# Patient Record
Sex: Female | Born: 1965 | Race: White | Hispanic: No | Marital: Single | Smoking: Never smoker
Health system: Southern US, Community
[De-identification: ages and names within clinical notes are randomized; demographics above are authoritative.]

## PROBLEM LIST (undated history)

## (undated) DIAGNOSIS — H3320 Serous retinal detachment, unspecified eye: Secondary | ICD-10-CM

## (undated) DIAGNOSIS — F5101 Primary insomnia: Secondary | ICD-10-CM

## (undated) DIAGNOSIS — M19049 Primary osteoarthritis, unspecified hand: Secondary | ICD-10-CM

## (undated) DIAGNOSIS — E0590 Thyrotoxicosis, unspecified without thyrotoxic crisis or storm: Secondary | ICD-10-CM

## (undated) DIAGNOSIS — F419 Anxiety disorder, unspecified: Secondary | ICD-10-CM

## (undated) HISTORY — DX: Anxiety disorder, unspecified: F41.9

## (undated) HISTORY — DX: Primary osteoarthritis, unspecified hand: M19.049

## (undated) HISTORY — PX: OTHER SURGICAL HISTORY: SHX169

## (undated) HISTORY — DX: Thyrotoxicosis, unspecified without thyrotoxic crisis or storm: E05.90

## (undated) HISTORY — DX: Serous retinal detachment, unspecified eye: H33.20

## (undated) HISTORY — DX: Primary insomnia: F51.01

## (undated) HISTORY — PX: BREAST REDUCTION SURGERY: SHX8

## (undated) HISTORY — PX: PLACEMENT OF BREAST IMPLANTS: SHX6334

---

## 2017-07-08 DIAGNOSIS — E038 Other specified hypothyroidism: Secondary | ICD-10-CM | POA: Insufficient documentation

## 2017-07-08 DIAGNOSIS — R011 Cardiac murmur, unspecified: Secondary | ICD-10-CM | POA: Insufficient documentation

## 2017-07-08 DIAGNOSIS — R002 Palpitations: Secondary | ICD-10-CM | POA: Insufficient documentation

## 2017-07-08 DIAGNOSIS — R000 Tachycardia, unspecified: Secondary | ICD-10-CM | POA: Insufficient documentation

## 2018-01-24 ENCOUNTER — Other Ambulatory Visit: Payer: Self-pay | Admitting: Gerontology

## 2018-01-24 DIAGNOSIS — N63 Unspecified lump in unspecified breast: Secondary | ICD-10-CM

## 2018-02-07 ENCOUNTER — Ambulatory Visit
Admission: RE | Admit: 2018-02-07 | Discharge: 2018-02-07 | Disposition: A | Discharge status: Home / Self Care | Payer: PRIVATE HEALTH INSURANCE | Source: Ambulatory Visit | Attending: Gerontology | Admitting: Gerontology

## 2018-02-07 ENCOUNTER — Other Ambulatory Visit: Payer: Self-pay

## 2018-02-07 DIAGNOSIS — N63 Unspecified lump in unspecified breast: Secondary | ICD-10-CM

## 2018-03-22 ENCOUNTER — Encounter: Payer: Self-pay | Admitting: Neurology

## 2018-05-26 DIAGNOSIS — Z9882 Breast implant status: Secondary | ICD-10-CM | POA: Insufficient documentation

## 2018-06-02 ENCOUNTER — Institutional Professional Consult (permissible substitution): Payer: PRIVATE HEALTH INSURANCE | Admitting: Plastic Surgery

## 2018-06-02 ENCOUNTER — Encounter: Payer: Self-pay | Admitting: Plastic Surgery

## 2018-06-02 ENCOUNTER — Ambulatory Visit: Payer: PRIVATE HEALTH INSURANCE | Admitting: Plastic Surgery

## 2018-06-02 VITALS — BP 133/81 | HR 76 | Temp 98.4°F | Ht 65.0 in | Wt 185.0 lb

## 2018-06-02 DIAGNOSIS — Z9882 Breast implant status: Secondary | ICD-10-CM

## 2018-06-02 NOTE — Progress Notes (Signed)
Patient ID: Deborah Crawford, female    DOB: July 22, 1965, 53 y.o.   MRN: [id number]   Chief Complaint  Patient presents with  . Advice Only    Remove and replace implants    Patient is a 53 year old white female here for consultation for her breasts.  The patient has had a somewhat complicated history of breast augmentation.  [place] was living in [place] until about a year ago when she moved to [place].  All the surgeries were done prior to her move.  She had saline implants placed for a marked asymmetry in her breasts.  The right was much smaller than the left.  There was a problem with the implants twice at which time she had the saline implants removed.  She had 680 cc implants placed.  At some point she had a reduction bilaterally. About 2 years ago she had the large implants removed and placement of Allergan Naturelle 295 cc textured anatomic silicone implants (SN [id number] reference TRM-295).  She is happy with the appearance and symmetry.  Her major concern today is the textured implants and chronic pain in her breasts.  She gets mammograms regularly and is due for one in February.  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  She is 5 feet 5 inches tall, weight 185 pounds.  Preop bra 36 D.  She does not want to be any larger perhaps a little bit smaller.  She has some bottoming out of the current implants.  Her areola to sternal distance is 23 cm bilaterally.  She definitely does not want textured implants placed.   Review of Systems  Constitutional: Negative for activity change.  HENT: Negative.   Eyes: Positive for discharge.  Respiratory: Negative.   Cardiovascular: Negative.  Negative for leg swelling.  Gastrointestinal: Negative.  Negative for abdominal distention and abdominal pain.  Endocrine: Negative.   Genitourinary: Negative.   Musculoskeletal: Negative.   Skin: Negative.  Negative for wound.  Hematological: Negative.   Psychiatric/Behavioral: Negative.     History  reviewed. No pertinent past medical history.  History reviewed. No pertinent surgical history.    Current Outpatient Medications:  .  DULoxetine (CYMBALTA) 30 MG capsule, Take 1 capsule by mouth daily., Disp: , Rfl:  .  levothyroxine (SYNTHROID, LEVOTHROID) 150 MCG tablet, Take 1 tablet by mouth daily., Disp: , Rfl:    Objective:   Vitals:   06/02/18 0830  BP: 133/81  Pulse: 76  Temp: 98.4 F (36.9 C)  SpO2: 96%    Physical Exam Vitals signs and nursing note reviewed.  Constitutional:      Appearance: Normal appearance.  HENT:     Head: Normocephalic and atraumatic.     Nose: Nose normal.  Eyes:     Extraocular Movements: Extraocular movements intact.     Pupils: Pupils are equal, round, and reactive to light.  Cardiovascular:     Rate and Rhythm: Normal rate.  Pulmonary:     Effort: Pulmonary effort is normal.  Abdominal:     General: Abdomen is flat. There is no distension.     Tenderness: There is no abdominal tenderness. There is no guarding.  Neurological:     General: No focal deficit present.     Mental Status: She is alert.  Psychiatric:        Mood and Affect: Mood normal.        Thought Content: Thought content normal.        Judgment: Judgment normal.  Assessment & Plan:  History of breast implant  The patient Is interested in a quote for removal of bilateral breast implants and replacement with smooth gel implants.  She will need a little bit of a mastopexy as well. She is going to get her next mammogram in February and will send [place] the results.  We also discussed high protein diet and adding MVI and Vit C.  Alena Bills , DO

## 2018-06-02 NOTE — Progress Notes (Signed)
[facility] Clinic Note - Initial Visit   Date: 06/05/18  RYLA CAUTHON MRN: [id number] DOB: [birth year]   Dear Dr. Fara Olden:   Thank you for your kind referral of Deborah Crawford for consultation of bilateral hand paresthesias. Although her history is well known to you, please allow [place] to reiterate it for the purpose of our medical record. The patient was accompanied to the clinic by self.    History of Present Illness: Deborah Crawford is a 53 y.o. right-handed Caucasian female with thyroidism, anxiety/depression, and insomnia presenting for evaluation of bilateral hand paresthesias.    Starting in 2015, she developed intermittent numbness over the right hand.  Since the fall of 2019, she began having numbness of the left hand and sometimes, her entire arms feel numb.  She also has tingling over the hands, which is worse over the 4th digit on the right.  Symptoms are worse at nighttime and often wake her up from sleeping.  She often drops objects and has a sensation of weakness.  She does not have neck pain or radicular symptoms.  She now has constant nagging pain over the hands.  She works as a Insurance underwriter and has difficulty with tasks because of her hand discomfort.  She has tried accupuncture, cupping, and chiropractor services without any lasting relief.    She has mid-low back pain, especially between the scapula which is more achy.    Out-side paper records, electronic medical record, and images have been reviewed where available and summarized as:  Labs 02/27/2018: TSH 0.9, ESR 9, RF negative  Past Medical History:  Diagnosis Date  . Anxiety   . Hand arthritis   . hypothyroidism   . Primary insomnia   . Retinal detachment     Past Surgical History:  Procedure Laterality Date  . BREAST REDUCTION SURGERY    . PLACEMENT OF BREAST IMPLANTS    . tummy tuck       Medications:  Outpatient Encounter Medications as of  06/05/2018  Medication Sig  . DULoxetine (CYMBALTA) 30 MG capsule Take 1 capsule by mouth daily.  [name] levothyroxine (SYNTHROID, LEVOTHROID) 150 MCG tablet Take 1 tablet by mouth daily.   No facility-administered encounter medications on file as of 06/05/2018.      Allergies:  Allergies  Allergen Reactions  . Oxycodone-Acetaminophen Other (See Comments)    Severe migraine  . Latex Other (See Comments), Itching and Rash    Family History: Family History  Problem Relation Age of Onset  . Esophageal cancer Mother   . Rashes / Skin problems Mother   . Rheum arthritis Mother   . Hypertension Father   . Hypertension Maternal Grandmother   . Hypertension Maternal Grandfather     Social History: Social History   Tobacco Use  . Smoking status: Never Smoker  . Smokeless tobacco: Never Used  Substance Use Topics  . Alcohol use: Yes    Comment: Socially on the weekends  . Drug use: Never   Social History   Social History Narrative   Lives alone in a 2 story home.  No children.    Highest level of education:  Bachelors x 2    Review of Systems:  CONSTITUTIONAL: No fevers, chills, night sweats, or weight loss.   EYES: No visual changes or eye pain ENT: No hearing changes.  No history of nose bleeds.   RESPIRATORY: No cough, wheezing and shortness of breath.   CARDIOVASCULAR: Negative for chest pain, and  palpitations.   GI: Negative for abdominal discomfort, blood in stools or black stools.  No recent change in bowel habits.   GU:  No history of incontinence.   MUSCLOSKELETAL: No history of joint pain or swelling.  No myalgias.   SKIN: Negative for lesions, rash, and itching.   HEMATOLOGY/ONCOLOGY: Negative for prolonged bleeding, bruising easily, and swollen nodes.  No history of cancer.   ENDOCRINE: Negative for cold or heat intolerance, polydipsia or goiter.   PSYCH:  No depression or anxiety symptoms.   NEURO: As Above.   Vital Signs:  BP 130/80   Pulse 78   Ht 5' 5"   (1.651 m)   Wt 188 lb 4 oz (85.4 kg)   SpO2 98%   BMI 31.33 kg/m   General Medical Exam:   General:  Well appearing, comfortable.   Eyes/ENT: see cranial nerve examination.   Neck: No masses appreciated.  Full range of motion without tenderness.  No carotid bruits. Respiratory:  Clear to auscultation, good air entry bilaterally.   Cardiac:  Regular rate and rhythm, no murmur.   Extremities:  No deformities, edema, or skin discoloration.  Skin:  No rashes or lesions.  Neurological Exam: MENTAL STATUS including orientation to time, place, person, recent and remote memory, attention span and concentration, language, and fund of knowledge is normal.  Speech is not dysarthric.  CRANIAL NERVES: II:  No visual field defects.  Unremarkable fundi.   III-IV-VI: Pupils equal round and reactive to light.  Normal conjugate, extra-ocular eye movements in all directions of gaze.  No nystagmus.  No ptosis.   V:  Normal facial sensation.       VII:  Normal facial symmetry and movements.   VIII:  Normal hearing and vestibular function.   IX-X:  Normal palatal movement.   XI:  Normal shoulder shrug and head rotation.   XII:  Normal tongue strength and range of motion, no deviation or fasciculation.  MOTOR:  No atrophy, fasciculations or abnormal movements.  No pronator drift.  Tone is normal.    Right Upper Extremity:    Left Upper Extremity:    Deltoid  5/5   Deltoid  5/5   Biceps  5/5   Biceps  5/5   Triceps  5/5   Triceps  5/5   Wrist extensors  5/5   Wrist extensors  5/5   Wrist flexors  5/5   Wrist flexors  5/5   Finger extensors  5/5   Finger extensors  5/5   Finger flexors  5/5   Finger flexors  5/5   Dorsal interossei  5/5   Dorsal interossei  5/5   Abductor pollicis  5/5   Abductor pollicis  5/5   Tone (Ashworth scale)  0  Tone (Ashworth scale)  0   Right Lower Extremity:    Left Lower Extremity:    Hip flexors  5/5   Hip flexors  5/5   Hip extensors  5/5   Hip extensors  5/5     Knee flexors  5/5   Knee flexors  5/5   Knee extensors  5/5   Knee extensors  5/5   Dorsiflexors  5/5   Dorsiflexors  5/5   Plantarflexors  5/5   Plantarflexors  5/5   Toe extensors  5/5   Toe extensors  5/5   Toe flexors  5/5   Toe flexors  5/5   Tone (Ashworth scale)  0  Tone (Ashworth scale)  0  MSRs:  Right                                                                 Left brachioradialis 2+  brachioradialis 2+  biceps 2+  biceps 2+  triceps 2+  triceps 2+  patellar 2+  patellar 2+  ankle jerk 2+  ankle jerk 2+  Hoffman no  Hoffman no  plantar response down  plantar response down   SENSORY:  Mildly reduced temperature over the right 4th and 5th digit, otherwise, normal and symmetric perception of light touch, pinprick, vibration, and proprioception. Tinel's positive at at the wrist bilaterally, negative at the elbows.    COORDINATION/GAIT: Normal finger-to- nose-finger.  Intact rapid alternating movements bilaterally.  Gait narrow based and stable. Tandem and stressed gait intact.    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  Exam shows bilateral Tinel's sign at the wrist, which raises concern for CTS, however, clinically, she has paresthesias over the ulnar distribution. I will perform electrodiagnostic testing of the upper extremities to better localize  of her symptoms. In the meantime, I asked her to minimize hyperflexion at the wrist and elbow to see if this alleviates some of her discomfort.   Further recommendations pending results.   Thank you for allowing me to participate in patient's care.  If I can answer any additional questions, I would be pleased to do so.    Sincerely,    Christina Gintz K. Posey Pronto, DO

## 2018-06-05 ENCOUNTER — Ambulatory Visit: Payer: PRIVATE HEALTH INSURANCE | Admitting: Neurology

## 2018-06-05 ENCOUNTER — Encounter: Payer: Self-pay | Admitting: Neurology

## 2018-06-05 VITALS — BP 130/80 | HR 78 | Ht 65.0 in | Wt 188.2 lb

## 2018-06-05 DIAGNOSIS — R202 Paresthesia of skin: Secondary | ICD-10-CM

## 2018-06-05 NOTE — Patient Instructions (Addendum)
NCS/EMG of the arms. Please do not apply lotion.  ELECTROMYOGRAM AND NERVE CONDUCTION STUDIES (EMG/NCS) INSTRUCTIONS  How to Prepare The neurologist conducting the EMG will need to know if you have certain medical conditions. Tell the neurologist and other EMG lab personnel if you: . Have a pacemaker or any other electrical medical device . Take blood-thinning medications . Have hemophilia, a blood-clotting disorder that causes prolonged bleeding Bathing Take a shower or bath shortly before your exam in order to remove oils from your skin. Don't apply lotions or creams before the exam.  What to Expect You'll likely be asked to change into a hospital gown for the procedure and lie down on an examination table. The following explanations can help you understand what will happen during the exam.  . Electrodes. The neurologist or a technician places surface electrodes at various locations on your skin depending on where you're experiencing symptoms. Or the neurologist may insert needle electrodes at different sites depending on your symptoms.  . Sensations. The electrodes will at times transmit a tiny electrical current that you may feel as a twinge or spasm. The needle electrode may cause discomfort or pain that usually ends shortly after the needle is removed. If you are concerned about discomfort or pain, you may want to talk to the neurologist about taking a short break during the exam.  . Instructions. During the needle EMG, the neurologist will assess whether there is any spontaneous electrical activity when the muscle is at rest - activity that isn't present in healthy muscle tissue - and the degree of activity when you slightly contract the muscle.  He or she will give you instructions on resting and contracting a muscle at appropriate times. Depending on what muscles and nerves the neurologist is examining, he or she may ask you to change positions during the exam.  After your EMG You may  experience some temporary, minor bruising where the needle electrode was inserted into your muscle. This bruising should fade within several days. If it persists, contact your primary care doctor.

## 2018-06-06 ENCOUNTER — Encounter: Payer: Self-pay | Admitting: *Deleted

## 2018-06-06 ENCOUNTER — Ambulatory Visit (INDEPENDENT_AMBULATORY_CARE_PROVIDER_SITE_OTHER): Payer: PRIVATE HEALTH INSURANCE | Admitting: Neurology

## 2018-06-06 DIAGNOSIS — R202 Paresthesia of skin: Secondary | ICD-10-CM

## 2018-06-06 NOTE — Progress Notes (Signed)
    Follow-up Visit   Date: 06/06/18    Deborah Crawford MRN: [id number] DOB: Dec 23, 1965   Interim History: Deborah Crawford is a 53 y.o. right-handed Caucasian female with thyroidism, anxiety/depression, and insomnia returning to the clinic for follow-up of bilateral hand paresthesias.  The patient was accompanied to the clinic by self.  She has ongoing numbness/tingling of the hands since 2015 and is here for EDX of the hands.  Medications:  Current Outpatient Medications on File Prior to Visit  Medication Sig Dispense Refill  . DULoxetine (CYMBALTA) 30 MG capsule Take 1 capsule by mouth daily.    [name] levothyroxine (SYNTHROID, LEVOTHROID) 150 MCG tablet Take 1 tablet by mouth daily.     No current facility-administered medications on file prior to visit.     Allergies:  Allergies  Allergen Reactions  . Oxycodone-Acetaminophen Other (See Comments)    Severe migraine  . Latex Other (See Comments), Itching and Rash     Vital Signs:  There were no vitals taken for this visit.   Neurological Exam: MENTAL STATUS including orientation to time, place, person, recent and remote memory, attention span and concentration, language, and fund of knowledge is normal.  Speech is not dysarthric.  CRANIAL NERVES:   Face is symmetric.   MOTOR:  Motor strength is 5/5 in the upper extremities.  COORDINATION/GAIT:   Gait narrow based and stable.   Data: NCS/EMG of the arms 06/06/2018: 1. Bilateral median neuropathy at or distal to the wrist, consistent with a clinical diagnosis of carpal tunnel syndrome.  Overall, these findings are moderate in degree electrically and worse on the right. 2. Incidentally, there is evidence of bilateral Martin-Gruber anastomoses, a normal anatomic variant.  IMPRESSION/PLAN: Bilateral carpal tunnel syndrome, moderate, and worse on the right.  Strategies to minimize nerve compression at the wrist were discussed.  Management options including splinting,  injections, OT, and surgery was discussed.  As she has not tried conservative therapies, will start with using wrist splints at night and during the day as able.  She was advised to avoid boxing for the time being and letter was provided to allow her to be released from her gym contract.    Return to clinic in 3 months   Thank you for allowing me to participate in patient's care.  If I can answer any additional questions, I would be pleased to do so.    Sincerely,     K. Allena Katz, DO

## 2018-06-06 NOTE — Procedures (Signed)
[facility]  [address] [place], [address]  [place], [place] [address] Tel: [phone] Fax:  [phone] Test Date:  06/06/2018  Patient: Deborah Crawford DOB: 01/02/1966 Physician: Nita Sickle, DO  Sex: Female Height: 5\' 5"  Ref Phys: Nita Sickle, DO  ID#: [id number] Temp: 36.0C Technician:    Patient Complaints: This is a 53 year-old female referred for evaluation of bilateral hand paresthesias.  NCV & EMG Findings: Extensive electrodiagnostic testing of the right upper extremity and additional studies of the left shows:  1. Right median sensory response shows prolonged distal peak latency (4.5 ms) and reduced amplitude (11.7 V).  Left median sensory response is mildly prolonged (3.8 ms).  Bilateral ulnar sensory responses are within normal limits. 2. Bilateral median motor responses show prolonged latency (R5.4, L4.2 ms) and reduced amplitude; of note, there is evidence of bilateral Martin-Gruber anastomosis as seen by a greater proximal median amplitude and a motor response at the ulnar-wrist recording at the abductor pollicis brevis muscle.  Bilateral ulnar motor responses are within normal limits. 3. There is no evidence of active or chronic motor axonal loss changes affecting any of the tested muscles.  Motor unit configuration and recruitment pattern is within normal limits.  Impression: 1. Bilateral median neuropathy at or distal to the wrist, consistent with a clinical diagnosis of carpal tunnel syndrome.  Overall, these findings are moderate in degree electrically and worse on the right. 2. Incidentally, there is evidence of bilateral Martin-Gruber anastomoses, a normal anatomic variant.   ___________________________ Nita Sickle, DO    Nerve Conduction Studies Anti Sensory Summary Table   Stim Site NR Peak (ms) Norm Peak (ms) P-T Amp (V) Norm P-T Amp  Left Median Anti Sensory (2nd Digit)  36C  Wrist    3.8 <3.6 28.4 >15  Right Median Anti Sensory (2nd  Digit)  36C  Wrist    4.5 <3.6 11.7 >15  Left Ulnar Anti Sensory (5th Digit)  36C  Wrist    2.7 <3.1 30.4 >10  Right Ulnar Anti Sensory (5th Digit)  36C  Wrist    2.5 <3.1 33.1 >10   Motor Summary Table   Stim Site NR Onset (ms) Norm Onset (ms) O-P Amp (mV) Norm O-P Amp Site1 Site2 Delta-0 (ms) Dist (cm) Vel (m/s) Norm Vel (m/s)  Left Median Motor (Abd Poll Brev)  36C  Wrist    4.2 <4.0 4.1 >6 Elbow Wrist 4.3 28.0 65 >50  Elbow    8.5  5.7  Ulnar-wrist crossover Elbow 5.1 0.0    Ulnar-wrist crossover    3.4  5.3         Right Median Motor (Abd Poll Brev)  36C  Wrist    5.4 <4.0 4.5 >6 Elbow Wrist 4.0 28.0 70 >50  Elbow    9.4  5.8  Ulnar-wrist crossover Elbow 5.2 0.0    Ulnar-wrist crossover    4.2  6.9         Left Ulnar Motor (Abd Dig Minimi)  36C  Wrist    1.9 <3.1 7.7 >7 B Elbow Wrist 3.3 22.0 67 >50  B Elbow    5.2  6.2  A Elbow B Elbow 1.5 10.0 67 >50  A Elbow    6.7  6.0         Right Ulnar Motor (Abd Dig Minimi)  36C  Wrist    2.0 <3.1 7.9 >7 B Elbow Wrist 3.0 20.0 67 >50  B Elbow    5.0  7.0  A Elbow B Elbow 1.6 10.0 63 >50  A Elbow    6.6  6.3          EMG   Side Muscle Ins Act Fibs Psw Fasc Number Recrt Dur Dur. Amp Amp. Poly Poly. Comment  Left 1stDorInt Nml Nml Nml Nml Nml Nml Nml Nml Nml Nml Nml Nml N/A  Left Abd Poll Brev Nml Nml Nml Nml Nml Nml Nml Nml Nml Nml Nml Nml N/A  Left PronatorTeres Nml Nml Nml Nml Nml Nml Nml Nml Nml Nml Nml Nml N/A  Left Biceps Nml Nml Nml Nml Nml Nml Nml Nml Nml Nml Nml Nml N/A  Left Triceps Nml Nml Nml Nml Nml Nml Nml Nml Nml Nml Nml Nml N/A  Left Deltoid Nml Nml Nml Nml Nml Nml Nml Nml Nml Nml Nml Nml N/A  Right 1stDorInt Nml Nml Nml Nml Nml Nml Nml Nml Nml Nml Nml Nml N/A  Right Abd Poll Brev Nml Nml Nml Nml Nml Nml Nml Nml Nml Nml Nml Nml N/A  Right PronatorTeres Nml Nml Nml Nml Nml Nml Nml Nml Nml Nml Nml Nml N/A  Right Biceps Nml Nml Nml Nml Nml Nml Nml Nml Nml Nml Nml Nml N/A  Right Triceps Nml Nml Nml Nml Nml Nml Nml  Nml Nml Nml Nml Nml N/A  Right Deltoid Nml Nml Nml Nml Nml Nml Nml Nml Nml Nml Nml Nml N/A      Waveforms:

## 2018-06-09 ENCOUNTER — Encounter: Payer: Self-pay | Admitting: *Deleted

## 2018-06-23 ENCOUNTER — Other Ambulatory Visit: Payer: Self-pay | Admitting: Gerontology

## 2018-06-23 DIAGNOSIS — N63 Unspecified lump in unspecified breast: Secondary | ICD-10-CM

## 2018-06-29 ENCOUNTER — Ambulatory Visit
Admission: RE | Admit: 2018-06-29 | Discharge: 2018-06-29 | Disposition: A | Discharge status: Home / Self Care | Payer: PRIVATE HEALTH INSURANCE | Source: Ambulatory Visit | Attending: Gerontology | Admitting: Gerontology

## 2018-06-29 ENCOUNTER — Other Ambulatory Visit: Payer: Self-pay | Admitting: Gerontology

## 2018-06-29 DIAGNOSIS — N63 Unspecified lump in unspecified breast: Secondary | ICD-10-CM

## 2018-06-29 DIAGNOSIS — G5603 Carpal tunnel syndrome, bilateral upper limbs: Secondary | ICD-10-CM | POA: Insufficient documentation

## 2018-07-10 ENCOUNTER — Other Ambulatory Visit: Payer: PRIVATE HEALTH INSURANCE

## 2018-10-13 ENCOUNTER — Ambulatory Visit: Payer: PRIVATE HEALTH INSURANCE | Admitting: Neurology

## 2019-02-05 ENCOUNTER — Ambulatory Visit (INDEPENDENT_AMBULATORY_CARE_PROVIDER_SITE_OTHER): Payer: PRIVATE HEALTH INSURANCE | Admitting: Psychiatry

## 2019-02-05 ENCOUNTER — Other Ambulatory Visit: Payer: Self-pay

## 2019-02-05 DIAGNOSIS — F332 Major depressive disorder, recurrent severe without psychotic features: Secondary | ICD-10-CM | POA: Diagnosis not present

## 2019-02-05 NOTE — Progress Notes (Signed)
[facility] Counselor Initial Adult Exam  Name: Deborah Crawford Date: 02/05/2019 MRN: [id number] DOB: May 27, 1965 PCP: Deborah Ishihara, MD  Time spent:  60 minutes   11:00am to 12:00noon   Guardian/Payee:  patient   Paperwork requested:  No   Reason for Visit /Presenting Problem:  Grief (mom died 01/01/2019), depressed, anxiety, "going through menopause", weight gain and eating for comfort, gets physical symptoms when she is acutely depressed or anxious.  Feels her depression is her biggest symptom.  Tearful, angry, sad, depressed.  Denies SI.  Mental Status Exam:   Appearance:   Well Groomed     Behavior:  Appropriate and Sharing  Motor:  Normal  Speech/Language:   Clear and Coherent  Affect:  Depressed and anxious  Mood:  anxious, depressed and sad  Thought process:  normal  Thought content:    WNL  Sensory/Perceptual disturbances:    WNL  Orientation:  oriented to person, place, time/date, situation, day of week, month of year and year  Attention:  Good  Concentration:  Good  Memory:  WNL  Fund of knowledge:   Good  Insight:    Good  Judgment:   Fair (worse with food and other things under stress)  Impulse Control:  Fair (worse with food and other things under stress)   Reported Symptoms:  Grief, sadness, depression, anxiety, anger and short-tempered, emotional eating  Risk Assessment: Danger to Self:  No Self-injurious Behavior: No Danger to Others: No Duty to Warn:no Physical Aggression / Violence:No  Access to Firearms a concern: No  Gang Involvement:No Patient / guardian was educated about steps to take if suicide or homicide risk level increases between visits: Patient denies SI or intent. Thoughts previously but denies any now.  Previously, about 20 yrs ago had SI when taking Effexor.  While future psychiatric events cannot be accurately predicted, the patient does not currently require acute inpatient psychiatric care and does not currently meet [facility] involuntary commitment criteria.  Substance Abuse History: Current substance abuse: No     Past Psychiatric History:   Previous psychological history is significant for anorexia, anxiety and depression Outpatient Providers: in [place], [place] History of Psych Hospitalization: No  Psychological Testing: n/a   Abuse History: Victim of No., n/a   Report needed: No. Victim of Neglect:No. Perpetrator of n/a  Witness / Exposure to Domestic Violence: No   Protective Services Involvement: No  Witness to [employer] Violence:  No   Family History:   Reviewed with patient and she states info is accurate.  Family History  Problem Relation Age of Onset  . Esophageal cancer Mother   . Rashes / Skin problems Mother   . Rheum arthritis Mother   . Hypertension Father   . Hypertension Maternal Grandmother   . Hypertension Maternal Grandfather     Living situation: the patient lives alone  Sexual Orientation:  Straight  Relationship Status: single, not married Name of spouse / other: n/a             If a parent, number of children / ages: no kids  Support Systems; friends; dad is 15 and I don't really talk to him about issues  Financial Stress:  No   Income/Employment/Disability: Employment; works at [employer] Service: No   Educational History: Education: Risk manager:   none  Any cultural differences that may affect / interfere with treatment:  not applicable   Recreation/Hobbies:  "Nothing right now"; I do have bluebirds in  backyard that I enjoy; just got a dog.  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  Legal History: Pending legal issue / charges: none reported. History of legal issue / charges: none  reported  Medical History/Surgical History:Reviewed with patient and she reports that info is accurate. Past Medical History:  Diagnosis Date  . Anxiety   . Hand arthritis   . hypothyroidism   . Primary insomnia   . Retinal detachment     Past Surgical History:  Procedure Laterality Date  . BREAST REDUCTION SURGERY    . PLACEMENT OF BREAST IMPLANTS    . tummy tuck      Medications: Current Outpatient Medications  Medication Sig Dispense Refill  . DULoxetine (CYMBALTA) 30 MG capsule Take 1 capsule by mouth daily.    [name] levothyroxine (SYNTHROID, LEVOTHROID) 150 MCG tablet Take 1 tablet by mouth daily.     No current facility-administered medications for this visit.     Allergies  Allergen Reactions  . Oxycodone-Acetaminophen Other (See Comments)    Severe migraine  . Latex Other (See Comments), Itching and Rash    Diagnoses:    ICD-10-CM   1. Severe episode of recurrent major depressive disorder, without psychotic features ([place])  F33.2    Subjective: Patient is 53 yr old, single, never married, female.  Mom died in 2018/12/09 of cancer, and patient adds "We were co-dependent."  Dad is 4 and has some health issues (kidneys, back surgery, gout).  Has 2 sisters, ages 74 and 46, and patient closest to the 51 yr old who lives in [place], [place].  Does see this sister often, but not close to the older sister (lives in [place], [place]).  Denies SI "because I would never do that to my family."  Has felt this depressed since just before mom died in Dec 09, 2022.  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 Also struggles with emotional eating, which began before mom's death.  Got help for that and depression, about 20 yrs in [place], then in [place] about 2-3 yrs ago, and just beginning tx here.  PCP office has had her on Cymbalta but patient would like to be re-evaluated for meds for her  depression and anxiety. Due to time constraints,  Tx plan begun and to be completed later at next session.   Plan of Care:  Patient not signing tx plan on computer screen due to [place].  Treatment Goals: Goals remain on tx plan as patient works on strategies to reach her goals.   Long term goal: Appropriately grieve the loss of her mother to normalize mood and reach a level of adaptive functioning where daily life is not disrupted.   Short term goal: Verbalize any unresolved grief issues that may be contributing to depression.  Strategy: Explore the role of unresolved grief issues as they relate to patient's current depression.  Progress: This is patient's first session and she is experiencing acute grief.  She does report that her grief over mother's death 2 mos ago is her "biggest stressor".  Has struggled previously with emotional concerns and she acknowledges that she and mom "were co-dependent" for a long time. 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  Will follow through with patient on her grief issues and her thought patterns next visit.    *Referring patient for med eval with provider in our practice, Yvette Rackegina Mozingo, P.M.H.N.P., D.N.P.     Return appt within 2 weeks.   Deborah Fareeborah Molina Hollenback, LCSW

## 2019-02-06 ENCOUNTER — Encounter: Payer: Self-pay | Admitting: Psychiatry

## 2019-02-07 ENCOUNTER — Other Ambulatory Visit: Payer: Self-pay

## 2019-02-07 ENCOUNTER — Ambulatory Visit (INDEPENDENT_AMBULATORY_CARE_PROVIDER_SITE_OTHER): Payer: PRIVATE HEALTH INSURANCE | Admitting: Adult Health

## 2019-02-07 ENCOUNTER — Encounter: Payer: Self-pay | Admitting: Adult Health

## 2019-02-07 DIAGNOSIS — F4310 Post-traumatic stress disorder, unspecified: Secondary | ICD-10-CM | POA: Diagnosis not present

## 2019-02-07 DIAGNOSIS — F411 Generalized anxiety disorder: Secondary | ICD-10-CM

## 2019-02-07 DIAGNOSIS — G4700 Insomnia, unspecified: Secondary | ICD-10-CM | POA: Diagnosis not present

## 2019-02-07 DIAGNOSIS — F331 Major depressive disorder, recurrent, moderate: Secondary | ICD-10-CM

## 2019-02-07 MED ORDER — DULOXETINE HCL 30 MG PO CPEP: 30.0000 mg | ORAL_CAPSULE | Freq: Every day | ORAL | 5 refills | Status: DC

## 2019-02-07 MED ORDER — HYDROXYZINE HCL 25 MG PO TABS: ORAL_TABLET | ORAL | 0 refills | Status: DC

## 2019-02-07 MED ORDER — ARIPIPRAZOLE 2 MG PO TABS: 2.0000 mg | ORAL_TABLET | Freq: Every day | ORAL | 2 refills | Status: DC

## 2019-02-07 NOTE — Progress Notes (Signed)
[facility] MD/PA/NP Initial Note  02/07/2019 8:53 AM Deborah Crawford  MRN:  [id number]  Chief Complaint:  Chief Complaint    Anxiety; Depression      HPI:   Referred by PCP  Describes mood today as "so-so". Pleasant. Flat. Tearful. Mood symptoms - reports depression, anxiety, and irritability. Stating "I'm having a very difficult time". Mother passed away from esophageal cancer in July. Took a leave of absence from work. Was her primary caretaker the last 3 months of her life. Has returned to work, but fights "walking out veryday". Continues to struggle with loss of mother "daily". Has started therapy. Does not feel like current medications are working well. Stable interest and motivation. Taking medications as prescribed.  Energy levels decreased. Stating "I'm exhausted". Feels like job is "very physical". Active, does not have a regular exercise routine.  Enjoys some usual interests and activities. Single - lives alone. Has a new dog poodle "koha". No family in the area. From [employer].  Appetite increased  Weight gain - 40 pounds. Stating "I lose control with food". Doing "nutri-system currently". Sleeps better some nights than others. Averages 6 to 10 hours. Stating "I'm either sleeping or not". Sleep changes since being depressed. Having terrible dreams about her mother. Had a "horrible" death. Mother "screaming for me". Focus and concentration difficulties - feels scattered. Completing tasks. Managing aspects of household. Works full time at [employer].  Denies SI or HI. Denies AH or VH.  Seeing [name] for therapy - one session.  Previous medications: Prozac-tired, Zoloft, Lexapro, Paxil, Effexor-SI, Wellbutrin-SI, Buspar-nightmares,   Visit Diagnosis:    ICD-10-CM   1. Major depressive disorder, recurrent episode, moderate (HCC)  F33.1   2. Generalized anxiety disorder  F41.1   3. Insomnia, unspecified type  G47.00   4. PTSD (post-traumatic stress disorder)  F43.10       Past Psychiatric History: Denies  Past Medical History:  Past Medical History:  Diagnosis Date  . Anxiety   . Hand arthritis   . hypothyroidism   . Primary insomnia   . Retinal detachment     Past Surgical History:  Procedure Laterality Date  . BREAST REDUCTION SURGERY    . PLACEMENT OF BREAST IMPLANTS    . tummy tuck      Family Psychiatric History: Mother with depression and anxiety  Family History:  Family History  Problem Relation Age of Onset  . Esophageal cancer Mother   . Rashes / Skin problems Mother   . Rheum arthritis Mother   . Hypertension Father   . Hypertension Maternal Grandmother   . Hypertension Maternal Grandfather     Social History:  Social History   Socioeconomic History  . Marital status: Single    Spouse name: Not on file  . Number of children: 0  . Years of education: Not on file  . Highest education level: Not on file  Occupational History  . Occupation: mammogram tech    Employer: [employer]  Social Needs  . Financial resource strain: Not on file  . Food insecurity    Worry: Not on file    Inability: Not on file  . Transportation needs    Medical: Not on file    Non-medical: Not on file  Tobacco Use  . Smoking status: Never Smoker  . Smokeless tobacco: Never Used  Substance and Sexual Activity  . Alcohol use: Yes    Comment: Socially on the weekends  . Drug use: Never  . Sexual  activity: Not on file  Lifestyle  . Physical activity    Days per week: Not on file    Minutes per session: Not on file  . Stress: Not on file  Relationships  . Social Herbalist on phone: Not on file    Gets together: Not on file    Attends religious service: Not on file    Active member of club or organization: Not on file    Attends meetings of clubs or organizations: Not on file    Relationship status: Not on file  Other Topics Concern  . Not on file  Social History Narrative   Lives alone in a 2 story home.  No  children.    Highest level of education:  Bachelors x 2    Allergies:  Allergies  Allergen Reactions  . Oxycodone-Acetaminophen Other (See Comments)    Severe migraine  . Latex Other (See Comments), Itching and Rash    Metabolic Disorder Labs: No results found for: HGBA1C, MPG No results found for: PROLACTIN No results found for: CHOL, TRIG, HDL, CHOLHDL, VLDL, LDLCALC No results found for: TSH  Therapeutic Level Labs: No results found for: LITHIUM No results found for: VALPROATE No components found for:  CBMZ  Current Medications: Current Outpatient Medications  Medication Sig Dispense Refill  . Cholecalciferol (D2000 ULTRA STRENGTH) 50 MCG (2000 UT) CAPS Take by mouth.    . doxycycline (PERIOSTAT) 20 MG tablet     . DULoxetine (CYMBALTA) 30 MG capsule Take 1 capsule by mouth daily.    [name] levothyroxine (SYNTHROID, LEVOTHROID) 150 MCG tablet Take 1 tablet by mouth daily.    . polyethylene glycol-electrolytes (NULYTELY/GOLYTELY) 420 g solution      No current facility-administered medications for this visit.     Medication Side Effects: none  Orders placed this visit:  No orders of the defined types were placed in this encounter.   Psychiatric Specialty Exam:  ROS  There were no vitals taken for this visit.There is no height or weight on file to calculate BMI.  General Appearance: Neat and Well Groomed  Eye Contact:  Good  Speech:  Clear and Coherent  Volume:  Normal  Mood:  Anxious, Depressed and Irritable  Affect:  Appropriate  Thought Process:  Coherent  Orientation:  Full (Time, Place, and Person)  Thought Content: Logical   Suicidal Thoughts:  No  Homicidal Thoughts:  No  Memory:  WNL  Judgement:  Good  Insight:  Good  Psychomotor Activity:  Normal  Concentration:  Concentration: Good  Recall:  Good  Fund of Knowledge: Good  Language: Good  Assets:  Communication Skills Desire for Improvement Financial Resources/Insurance Housing Intimacy Leisure  Time Physical Health Resilience Social Support Talents/Skills Transportation Vocational/Educational  ADL's:  Intact  Cognition: WNL  Prognosis:  Good   Screenings: None  Receiving Psychotherapy: Yes   Treatment Plan/Recommendations:   Plan:  1. Add Abilify 2mg  daily 2. Add Hydroxyzine 25mg  tablet - 4 tablets 3. Continue Cymbalta 30mg  daily  Continue therapy  RTC 4 weeks  Patient advised to contact office with any questions, adverse effects, or acute worsening in signs and symptoms.  Discussed potential metabolic side effects associated with atypical antipsychotics, as well as potential risk for movement side effects. Advised pt to contact office if movement side effects occur.   Aloha Gell, NP

## 2019-02-22 ENCOUNTER — Ambulatory Visit (INDEPENDENT_AMBULATORY_CARE_PROVIDER_SITE_OTHER): Payer: PRIVATE HEALTH INSURANCE | Admitting: Adult Health

## 2019-02-22 ENCOUNTER — Encounter: Payer: Self-pay | Admitting: Adult Health

## 2019-02-22 ENCOUNTER — Other Ambulatory Visit: Payer: Self-pay

## 2019-02-22 DIAGNOSIS — F411 Generalized anxiety disorder: Secondary | ICD-10-CM | POA: Diagnosis not present

## 2019-02-22 DIAGNOSIS — G4700 Insomnia, unspecified: Secondary | ICD-10-CM | POA: Diagnosis not present

## 2019-02-22 DIAGNOSIS — F331 Major depressive disorder, recurrent, moderate: Secondary | ICD-10-CM

## 2019-02-22 DIAGNOSIS — F4310 Post-traumatic stress disorder, unspecified: Secondary | ICD-10-CM

## 2019-02-22 MED ORDER — HYDROXYZINE HCL 25 MG PO TABS: ORAL_TABLET | ORAL | 2 refills | Status: DC

## 2019-02-22 MED ORDER — DULOXETINE HCL 60 MG PO CPEP: 60.0000 mg | ORAL_CAPSULE | Freq: Every day | ORAL | 5 refills | Status: DC

## 2019-02-22 NOTE — Progress Notes (Signed)
[facility] MD/PA/NP Initial Note  02/22/2019 10:24 AM Deborah Crawford  MRN:  [id number]  Chief Complaint:    HPI:   Referred by PCP  Describes mood today as "so-so". Pleasant. Flat. Tearful at times. Had a "meltdown" this morning. Mood symptoms - reports depression, anxiety, and irritability. Feels like memory has ben "off" for the past week. Feels like "she is in a fog". Has had a lot of headaches. Had a migraine once that caused "memory issues". Grieving loss of mother - "struggles" most days. Did not tolerate Abilify - anxious, restless, sweaty, achy. Does not feel like current medications are working well - would like to increase Cymbalta. Stable interest and motivation. Taking medications as prescribed.  Energy levels "better some days than others".  Active, does not have a regular exercise routine. 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 Single - lives alone with dog poodle "koha". No family in the area. Mostly staying home. Appetite increased. Weight gain - 40 pounds.  Sleeps better some nights than others. Averages 5 to 6 hours. Having weird dreams - "no more nightmares" Focus and concentration difficulties.Completing tasks. Managing aspects of household. Works full time at [employer].  Denies SI or HI. Denies AH or VH.  Seeing [name] for therapy - one session.  Previous medications: Prozac-tired, Zoloft, Lexapro, Paxil, Effexor-SI, Wellbutrin-SI, Buspar-nightmares, Abilify - side effects.  Visit Diagnosis:    ICD-10-CM   1. Major depressive disorder, recurrent episode, moderate (HCC)  F33.1   2. Generalized anxiety disorder  F41.1   3. Insomnia, unspecified type  G47.00   4. PTSD (post-traumatic stress disorder)  F43.10      Past Psychiatric History: Denies  Past Medical History:  Past Medical History:  Diagnosis Date  . Anxiety   . Hand arthritis   . hypothyroidism   . Primary insomnia   . Retinal detachment      Past Surgical History:  Procedure Laterality Date  . BREAST REDUCTION SURGERY    . PLACEMENT OF BREAST IMPLANTS    . tummy tuck      Family Psychiatric History: Mother with depression and anxiety  Family History:  Family History  Problem Relation Age of Onset  . Esophageal cancer Mother   . Rashes / Skin problems Mother   . Rheum arthritis Mother   . Hypertension Father   . Hypertension Maternal Grandmother   . Hypertension Maternal Grandfather     Social History:  Social History   Socioeconomic History  . Marital status: Single    Spouse name: Not on file  . Number of children: 0  . Years of education: Not on file  . Highest education level: Not on file  Occupational History  . Occupation: mammogram tech    Employer: [employer]  Social Needs  . Financial resource strain: Not on file  . Food insecurity    Worry: Not on file    Inability: Not on file  . Transportation needs    Medical: Not on file    Non-medical: Not on file  Tobacco Use  . Smoking status: Never Smoker  . Smokeless tobacco: Never Used  Substance and Sexual Activity  . Alcohol use: Yes    Comment: Socially on the weekends  . Drug use: Never  . Sexual activity: Not on file  Lifestyle  . Physical activity    Days per week: Not on file    Minutes per session: Not on file  . Stress: Not on file  Relationships  . Social Musicianconnections    Talks on phone: Not on file    Gets together: Not on file    Attends religious service: Not on file    Active member of club or organization: Not on file    Attends meetings of clubs or organizations: Not on file    Relationship status: Not on file  Other Topics Concern  . Not on file  Social History Narrative   Lives alone in a 2 story home.  No children.    Highest level of education:  Bachelors x 2    Allergies:  Allergies  Allergen Reactions  . Oxycodone-Acetaminophen Other (See Comments)    Severe migraine  . Latex Other (See Comments),  Itching and Rash    Metabolic Disorder Labs: No results found for: HGBA1C, MPG No results found for: PROLACTIN No results found for: CHOL, TRIG, HDL, CHOLHDL, VLDL, LDLCALC No results found for: TSH  Therapeutic Level Labs: No results found for: LITHIUM No results found for: VALPROATE No components found for:  CBMZ  Current Medications: Current Outpatient Medications  Medication Sig Dispense Refill  . ARIPiprazole (ABILIFY) 2 MG tablet Take 1 tablet (2 mg total) by mouth daily. 30 tablet 2  . Cholecalciferol (D2000 ULTRA STRENGTH) 50 MCG (2000 UT) CAPS Take by mouth.    . doxycycline (PERIOSTAT) 20 MG tablet     . DULoxetine (CYMBALTA) 30 MG capsule Take 1 capsule (30 mg total) by mouth daily. 30 capsule 5  . hydrOXYzine (ATARAX/VISTARIL) 25 MG tablet Take one tablet up to 4 times daily prn sleep/anxiety. 30 tablet 0  . levothyroxine (SYNTHROID, LEVOTHROID) 150 MCG tablet Take 1 tablet by mouth daily.    . polyethylene glycol-electrolytes (NULYTELY/GOLYTELY) 420 g solution      No current facility-administered medications for this visit.     Medication Side Effects: none  Orders placed this visit:  No orders of the defined types were placed in this encounter.   Psychiatric Specialty Exam:  ROS  There were no vitals taken for this visit.There is no height or weight on file to calculate BMI.  General Appearance: Neat and Well Groomed  Eye Contact:  Good  Speech:  Clear and Coherent  Volume:  Normal  Mood:  Anxious, Depressed and Irritable  Affect:  Appropriate  Thought Process:  Coherent  Orientation:  Full (Time, Place, and Person)  Thought Content: Logical   Suicidal Thoughts:  No  Homicidal Thoughts:  No  Memory:  WNL  Judgement:  Good  Insight:  Good  Psychomotor Activity:  Normal  Concentration:  Concentration: Good  Recall:  Good  Fund of Knowledge: Good  Language: Good  Assets:  Communication Skills Desire for Improvement Financial  Resources/Insurance Housing Intimacy Leisure Time Physical Health Resilience Social Support Talents/Skills Transportation Vocational/Educational  ADL's:  Intact  Cognition: WNL  Prognosis:  Good   Screenings: None  Receiving Psychotherapy: Yes   Treatment Plan/Recommendations:   Plan:  1. D/C Abilify 2mg  daily - did not tolerate - multiple side effects - took twice 2. Hydroxyzine 25mg  tablet - 4 tablets - will continue 3. Increase Cymbalta 30mg  to 60mg  daily 4. Will discuss Tramadol with PCP - feels like it does "best" with keeping her calm - Can "physically tell it helps her".   Continue therapy  RTC 4 weeks  Patient advised to contact office with any questions, adverse effects, or acute worsening in signs and symptoms.  Discussed potential metabolic side effects associated with atypical antipsychotics,  as well as potential risk for movement side effects. Advised pt to contact office if movement side effects occur.   Aloha Gell, NP

## 2019-02-23 ENCOUNTER — Ambulatory Visit: Payer: PRIVATE HEALTH INSURANCE | Admitting: Psychiatry

## 2019-02-23 ENCOUNTER — Telehealth: Payer: Self-pay | Admitting: Adult Health

## 2019-02-23 NOTE — Telephone Encounter (Signed)
Patient called and said that her pcp said he will not call the tramadol for her . So she wanted you to know that. You can contact her at [phone]

## 2019-02-26 NOTE — Telephone Encounter (Signed)
We could do a trial of Latuda if interested.

## 2019-02-27 NOTE — Telephone Encounter (Signed)
Left voicemail to call back to discuss

## 2019-02-28 ENCOUNTER — Ambulatory Visit: Payer: PRIVATE HEALTH INSURANCE | Admitting: Adult Health

## 2019-03-05 DIAGNOSIS — Z0289 Encounter for other administrative examinations: Secondary | ICD-10-CM

## 2019-03-07 ENCOUNTER — Ambulatory Visit (INDEPENDENT_AMBULATORY_CARE_PROVIDER_SITE_OTHER): Payer: PRIVATE HEALTH INSURANCE | Admitting: Psychiatry

## 2019-03-07 ENCOUNTER — Other Ambulatory Visit: Payer: Self-pay

## 2019-03-07 DIAGNOSIS — F331 Major depressive disorder, recurrent, moderate: Secondary | ICD-10-CM | POA: Diagnosis not present

## 2019-03-07 NOTE — Progress Notes (Signed)
[facility] Counselor/Therapist Progress Note  Patient ID: Deborah Crawford, MRN: [id number],    Date: 03/07/2019  Time Spent: 60 minutes 11:00am to 12:00noon  Treatment Type: Individual Therapy  Reported Symptoms: depression, anxiety, sadness  Mental Status Exam:  Appearance:   Casual     Behavior:  Appropriate, Sharing  Motor:  Normal  Speech/Language:   Normal Rate  Affect:  Depressed, Tearful and anxious  Mood:  anxious, depressed and irritable  Thought process:  goal directed  Thought content:    WNL  Sensory/Perceptual disturbances:    WNL  Orientation:  oriented to person, place, time/date, situation, day of week, month of year and year  Attention:  Good  Concentration:  Good  Memory:  WNL feeling scattered at times (grief related)  Fund of knowledge:   Good  Insight:    Good  Judgment:   Good  Impulse Control:  Fair   Risk Assessment: Danger to Self:  No Self-injurious Behavior: No Danger to Others: No Duty to Warn:no Physical Aggression / Violence:No  Access to Firearms a concern: No  Gang Involvement:No   Subjective: Patient in today and still having depression, anxiety, irritability, and lots of work stressors. *On FMLA til Nov. 15th".  Difficulties with "snapping at people" and written up at work. Trouble at work has led to more binge-eating and very unhappy with self.  Interventions: Cognitive Behavioral Therapy and Solution-Oriented/Positive Psychology, Grief Therapy  Diagnosis:   ICD-10-CM   1. Major depressive disorder, recurrent episode, moderate ([place])  F33.1     Plan of Care:  Patient not signing tx plan on computer screen due to [place].  Treatment Goals: Goals remain on tx plan as patient works on strategies to reach her goals.   Long term goal: Appropriately grieve the loss of her mother to normalize mood and reach a level of adaptive functioning where daily life is not disrupted.   Short term goal: Verbalize any unresolved  grief issues that may be contributing to depression.  Strategy: Explore the role of unresolved grief issues as they relate to patient's current depression.  Progress:  Patient very tearful at times today as she continues working on her grief re: mother's death and also processing some work conflicts she has had recently and is now on [employer] for approx 2-3 weeks.  Worked on goals today as she shared more of there grief of her mom's death, and also the hurt and anger she feels toward some of her co-workers for the way they are treating her, especially here at a time when she is already struggling.  Patient says she was accused of "snapping at coworkers" and "not being on top of things at work".  She feels they should be more sensitive to her needs since it hasn't been long since the death of her mom.  Encouraged patient's expression of grief today as she doesn't seem to have lots of support here. She has mentioned a sister living out of town, but wasn't clear on the closeness of their relationship.  Her binge-eating has re-occurred and we spoke about some strategies to help her manage her bingeing.  She began talking again about her work difficulties and   shared with me an example of when she was accused of being "snappy" with co-workers and from what she said, I could see where it could have been interpreted as snappy and shared that with her (tactfully).  Patient was receptive and did not over-react.  We discussed some  communication skills that could help her in conversations at work including paying attention to "how I say, what I say", active listening skills, pausing before speaking, and not responding "in the heat of the moment", and lowering her tone so that people can really hear her words and not just her tone.  Continues to deny any SI. Goal review with patient and pointed out some "positives" for her.               Next app within approx. 2 weeks.   Mathis Fare,  LCSW

## 2019-03-09 ENCOUNTER — Other Ambulatory Visit: Payer: Self-pay

## 2019-03-09 ENCOUNTER — Ambulatory Visit (INDEPENDENT_AMBULATORY_CARE_PROVIDER_SITE_OTHER): Payer: PRIVATE HEALTH INSURANCE | Admitting: Psychiatry

## 2019-03-09 DIAGNOSIS — F331 Major depressive disorder, recurrent, moderate: Secondary | ICD-10-CM | POA: Diagnosis not present

## 2019-03-09 NOTE — Progress Notes (Signed)
      [facility] Counselor/Therapist Progress Note  Patient ID: Deborah Crawford, MRN: [id number],    Date: 03/09/2019  Time Spent:  50 minutes 10:10am to 11:00am  Treatment Type: Individual Therapy  Reported Symptoms:  Grief, anger, sadness, tearfulness, feeling alone  Mental Status Exam:  Appearance:   Neat     Behavior:  Appropriate and Sharing  Motor:  Normal  Speech/Language:   Normal Rate  Affect:  Depressed and Tearful  Mood:  angry, anxious, depressed and sad  Thought process:  goal directed  Thought content:    WNL  Sensory/Perceptual disturbances:    WNL  Orientation:  oriented to person, place, time/date, situation, day of week, month of year and year  Attention:  Good  Concentration:  Good  Memory:  WNL  Fund of knowledge:   Good  Insight:    Good  Judgment:   Good  Impulse Control:  Fair   Risk Assessment: Danger to Self:  No Self-injurious Behavior: No Danger to Others: No Duty to Warn:no Physical Aggression / Violence:No  Access to Firearms a concern: No  Gang Involvement:No   Subjective: Patient in today with significant grief re: mother's death.  Anger, irritable, sad, "broken", and trying to figure out how to move forward.  Lots of negative self-talk.  Feels others don't understand.    Interventions: Cognitive Behavioral Therapy and Grief Therapy  Diagnosis:   ICD-10-CM   1. Major depressive disorder, recurrent episode, moderate ([place])  F33.1      Plan of Care: Patient not signing tx plan on computer screen due to [place].  Treatment Goals: Goals remain on tx plan as patient works on strategies to reach her goals.  Long term goal: Appropriately grieve the loss of her mother to normalize mood and reach a level of adaptive functioning where daily life is not disrupted.  Short term goal: Verbalize any unresolved grief issues that may be contributing to depression.  Strategy: Explore the role of unresolved grief issues as they  relate to patient's current depression.  Progress: Patient motivated and is goal-directed per goals and strategy above, as she comes in for appt today to work on grief re: mother's death.  She has had difficulties on her job and is now on a brief FMLA. Works today on her anger especially as well as deep feelings of loss and "abandonment".  Patient had been very close to her mom over the years so grief is acute at this point.  She has not had many people that she felt understood her so has been guarded in any expression of her feelings.  Did freely share some of her feelings openly today.  Talked about a plan to help her through the weekend including a couple of people she can have contact with and feel some support.  Being with her dog also provides support to her. Does find herself looking more for what may go wrong versus right.  Worked on how to reverse that view, even in the midst of deep grief. Will use her journal as a helpful tool in processing some of her thoughts and feelings.  Also to work on "staying in the present" versus always in past or jumping ahead too far.  Goal review and progress noted with patient.   Next appt within 2 weeks.   Shanon Ace, LCSW

## 2019-03-14 ENCOUNTER — Other Ambulatory Visit: Payer: Self-pay

## 2019-03-14 ENCOUNTER — Ambulatory Visit (INDEPENDENT_AMBULATORY_CARE_PROVIDER_SITE_OTHER): Payer: PRIVATE HEALTH INSURANCE | Admitting: Psychiatry

## 2019-03-14 DIAGNOSIS — F331 Major depressive disorder, recurrent, moderate: Secondary | ICD-10-CM

## 2019-03-14 NOTE — Addendum Note (Signed)
Addended byShanon Ace on: 03/14/2019 12:05 PM   Modules accepted: Level of Service

## 2019-03-14 NOTE — Progress Notes (Signed)
      [facility] Counselor/Therapist Progress Note  Patient ID: Deborah Crawford, MRN: [id number],    Date: 03/14/2019  Time Spent: 60 minutes   11:00am to 12:00noon  Treatment Type: Individual Therapy  Reported Symptoms: depression, anxiety, grief, tired, anger, impatient, short-tempered  Mental Status Exam:  Appearance:   Casual     Behavior:  Appropriate and Sharing  Motor:  Normal  Speech/Language:   Clear and Coherent  Affect:  Depressed and anxious  Mood:  anxious and depressed  Thought process:  goal directed  Thought content:    WNL  Sensory/Perceptual disturbances:    WNL  Orientation:  oriented to person, place, time/date, situation, day of week, month of year and year  Attention:  Good  Concentration:  Good  Memory:  WNL  Fund of knowledge:   Good  Insight:    Good  Judgment:   Good  Impulse Control:  Fair   Risk Assessment: Danger to Self:  No Self-injurious Behavior: No Danger to Others: No Duty to Warn:no Physical Aggression / Violence:No  Access to Firearms a concern: No  Gang Involvement:No   Subjective:  Patient in today reporting the symptoms noted above. Progressing some but wonders if she should stay in [place] or move back home near her 2 sisters and dad.  Currently to return to work Nov. 16, 2020, but patient is unsure what she plans to do. "I would have to be able to overlook how others behavior and pretend I'm 100% and am fine in order to go back to work." Ambivalence about returning.   Interventions: Solution-Oriented/Positive Psychology and Ego-Supportive  Diagnosis:   ICD-10-CM   1. Major depressive disorder, recurrent episode, moderate ([place])  F33.1     Plan of Care: Patient not signing tx plan on computer screen due to [place].  Treatment Goals: Goals remain on tx plan as patient works on strategies to reach her goals.  Long term goal: Appropriately grieve the loss of her mother to normalize mood and reach a level of adaptive  functioning where daily life is not disrupted.  Short term goal: Verbalize any unresolved grief issues that may be contributing to depression.  Strategy: Explore the role of unresolved grief issues as they relate to patient's current depression.  Progress: Patient continues to work on both her long and short term goals related to her grief in the loss of her mother. Continues to work today on appropriately facing her fears and deep grief in loss of her mom, "my best friend".  Has tried to deny feelings and "suck it up" so others wouldn't see, but she is understanding now that works against her.  Worked hard today on this issue.  Very tearful at times and also very clear in her expression of grief, the deep loss she feels.  Denies any SI. To continue next session and is using some journaling as needed in the meantime to help her sort through thoughts and feelings.  Good  Work today.  Goals reviewed and progress noted with patient.  Next appt within 2 weeks.   Shanon Ace, LCSW

## 2019-03-21 ENCOUNTER — Ambulatory Visit: Payer: PRIVATE HEALTH INSURANCE | Admitting: Psychiatry

## 2019-03-22 ENCOUNTER — Other Ambulatory Visit: Payer: Self-pay

## 2019-03-22 ENCOUNTER — Ambulatory Visit (INDEPENDENT_AMBULATORY_CARE_PROVIDER_SITE_OTHER): Payer: PRIVATE HEALTH INSURANCE | Admitting: Psychiatry

## 2019-03-22 ENCOUNTER — Ambulatory Visit (INDEPENDENT_AMBULATORY_CARE_PROVIDER_SITE_OTHER): Payer: PRIVATE HEALTH INSURANCE | Admitting: Adult Health

## 2019-03-22 ENCOUNTER — Encounter: Payer: Self-pay | Admitting: Adult Health

## 2019-03-22 DIAGNOSIS — F331 Major depressive disorder, recurrent, moderate: Secondary | ICD-10-CM | POA: Diagnosis not present

## 2019-03-22 DIAGNOSIS — F411 Generalized anxiety disorder: Secondary | ICD-10-CM | POA: Diagnosis not present

## 2019-03-22 DIAGNOSIS — G4700 Insomnia, unspecified: Secondary | ICD-10-CM

## 2019-03-22 DIAGNOSIS — F4310 Post-traumatic stress disorder, unspecified: Secondary | ICD-10-CM

## 2019-03-22 MED ORDER — HYDROXYZINE HCL 25 MG PO TABS: ORAL_TABLET | ORAL | 1 refills | Status: DC

## 2019-03-22 MED ORDER — LATUDA 20 MG PO TABS: ORAL_TABLET | ORAL | 1 refills | Status: DC

## 2019-03-22 MED ORDER — DULOXETINE HCL 60 MG PO CPEP: 60.0000 mg | ORAL_CAPSULE | Freq: Every day | ORAL | 1 refills | Status: DC

## 2019-03-22 NOTE — Progress Notes (Signed)
[facility] MD/PA/NP Initial Note  03/22/2019 2:54 PM Deborah Crawford  MRN:  [id number]  Chief Complaint:    HPI:   Referred by PCP  Describes mood today as "ok". Pleasant. Decreased tearfulness. Mood symptoms - reports decreased depression, anxiety, and irritability. Did not pick up Latuda samples before visit today - would like to try. Stating "I quit my job today". Felt like "it was the best thing for me". Felt like her employer was never "understanding" of what she was going through. Stating "I'm starting to see the light at the end of the tunnel". Feels like medications could work better "willing to try the [place]". Also stating "the fog is lifting", "my load is lightening up now". Seeing PCP for Thyroid issues tomorrow. Improved interest and motivation. Taking medications as prescribed.  Energy levels "lower".  Active, does not have a regular exercise routine. 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 Single - lives alone with dog poodle "koha". Taking dog to daycare some days. Mostly staying home.  Appetite increased. Weight gain - 40 pounds - "eating was out of control - doing better with that".  Sleeps better some nights than others - "comes in spells". Averages 5 to 6 hours. Restless some nights. Focus and concentration difficulties - "at times".Completing tasks. Managing aspects of household.  Denies SI or HI. Denies AH or VH.  Seeing [name] for therapy.  Previous medications: Prozac-tired, Zoloft, Lexapro, Paxil, Effexor-SI, Wellbutrin-SI, Buspar-nightmares, Abilify - side effects.  Visit Diagnosis:    ICD-10-CM   1. PTSD (post-traumatic stress disorder)  F43.10 DULoxetine (CYMBALTA) 60 MG capsule  2. Insomnia, unspecified type  G47.00 hydrOXYzine (ATARAX/VISTARIL) 25 MG tablet  3. Generalized anxiety disorder  F41.1 DULoxetine (CYMBALTA) 60 MG capsule    hydrOXYzine (ATARAX/VISTARIL) 25 MG tablet  4. Major depressive disorder,  recurrent episode, moderate ([facility])  F33.1 DULoxetine (CYMBALTA) 60 MG capsule    lurasidone (LATUDA) 20 MG TABS tablet     Past Psychiatric History: Denies  Past Medical History:  Past Medical History:  Diagnosis Date  . Anxiety   . Hand arthritis   . hypothyroidism   . Primary insomnia   . Retinal detachment     Past Surgical History:  Procedure Laterality Date  . BREAST REDUCTION SURGERY    . PLACEMENT OF BREAST IMPLANTS    . tummy tuck      Family Psychiatric History: Mother with depression and anxiety  Family History:  Family History  Problem Relation Age of Onset  . Esophageal cancer Mother   . Rashes / Skin problems Mother   . Rheum arthritis Mother   . Hypertension Father   . Hypertension Maternal Grandmother   . Hypertension Maternal Grandfather     Social History:  Social History   Socioeconomic History  . Marital status: Single    Spouse name: Not on file  . Number of children: 0  . Years of education: Not on file  . Highest education level: Not on file  Occupational History  . Occupation: mammogram tech    Employer: [employer]  Social Needs  . Financial resource strain: Not on file  . Food insecurity    Worry: Not on file    Inability: Not on file  . Transportation needs    Medical: Not on file    Non-medical: Not on file  Tobacco Use  . Smoking status: Never Smoker  . Smokeless tobacco: Never Used  Substance and Sexual Activity  . Alcohol use:  Yes    Comment: Socially on the weekends  . Drug use: Never  . Sexual activity: Not on file  Lifestyle  . Physical activity    Days per week: Not on file    Minutes per session: Not on file  . Stress: Not on file  Relationships  . Social Herbalist on phone: Not on file    Gets together: Not on file    Attends religious service: Not on file    Active member of club or organization: Not on file    Attends meetings of clubs or organizations: Not on file    Relationship status:  Not on file  Other Topics Concern  . Not on file  Social History Narrative   Lives alone in a 2 story home.  No children.    Highest level of education:  Bachelors x 2    Allergies:  Allergies  Allergen Reactions  . Oxycodone-Acetaminophen Other (See Comments)    Severe migraine  . Latex Other (See Comments), Itching and Rash    Metabolic Disorder Labs: No results found for: HGBA1C, MPG No results found for: PROLACTIN No results found for: CHOL, TRIG, HDL, CHOLHDL, VLDL, LDLCALC No results found for: TSH  Therapeutic Level Labs: No results found for: LITHIUM No results found for: VALPROATE No components found for:  CBMZ  Current Medications: Current Outpatient Medications  Medication Sig Dispense Refill  . Cholecalciferol (D2000 ULTRA STRENGTH) 50 MCG (2000 UT) CAPS Take by mouth.    . doxycycline (PERIOSTAT) 20 MG tablet     . DULoxetine (CYMBALTA) 60 MG capsule Take 1 capsule (60 mg total) by mouth daily. 90 capsule 1  . hydrOXYzine (ATARAX/VISTARIL) 25 MG tablet Take one tablet up to 4 times daily prn sleep/anxiety. 360 tablet 1  . levothyroxine (SYNTHROID, LEVOTHROID) 150 MCG tablet Take 1 tablet by mouth daily.    [name] lurasidone (LATUDA) 20 MG TABS tablet Take one tablet at dinner with 350 calories. 90 tablet 1  . polyethylene glycol-electrolytes (NULYTELY/GOLYTELY) 420 g solution     . spironolactone (ALDACTONE) 50 MG tablet TK 1 T PO QD UTD     No current facility-administered medications for this visit.     Medication Side Effects: none  Orders placed this visit:  No orders of the defined types were placed in this encounter.   Psychiatric Specialty Exam:  Review of Systems  Neurological: Negative for tremors and weakness.    There were no vitals taken for this visit.There is no height or weight on file to calculate BMI.  General Appearance: Neat and Well Groomed  Eye Contact:  Good  Speech:  Clear and Coherent  Volume:  Normal  Mood:  Anxious, Depressed  and Irritable - has improved  Affect:  Appropriate  Thought Process:  Coherent  Orientation:  Full (Time, Place, and Person)  Thought Content: Logical   Suicidal Thoughts:  No  Homicidal Thoughts:  No  Memory:  WNL  Judgement:  Good  Insight:  Good  Psychomotor Activity:  Normal  Concentration:  Concentration: Good  Recall:  Good  Fund of Knowledge: Good  Language: Good  Assets:  Communication Skills Desire for Improvement Financial Resources/Insurance Housing Intimacy Leisure Time Physical Health Resilience Social Support Talents/Skills Transportation Vocational/Educational  ADL's:  Intact  Cognition: WNL  Prognosis:  Good   Screenings: None  Receiving Psychotherapy: Yes   Treatment Plan/Recommendations:   Plan:  1. Latuda 20mg  daily - will give samples and send in  90 day supply 2. Hydroxyzine 25mg  tablet - 4 tablets - will continue 3. Cymbalta 60mg  daily  Continue therapy  RTC 4 weeks  Patient advised to contact office with any questions, adverse effects, or acute worsening in signs and symptoms.  Discussed potential metabolic side effects associated with atypical antipsychotics, as well as potential risk for movement side effects. Advised pt to contact office if movement side effects occur.   Dorothyann Gibbsegina N Cheridan Kibler, NP

## 2019-03-22 NOTE — Progress Notes (Signed)
      [facility] Counselor/Therapist Progress Note  Patient ID: Deborah Crawford, MRN: [id number],    Date: 03/22/2019  Time Spent: 60 minutes  1:00pm to 2:00pm   Treatment Type: Individual Therapy  Reported Symptoms: anxiety  Mental Status Exam:  Appearance:   Casual     Behavior:  Appropriate and Sharing  Motor:  Normal  Speech/Language:   Clear and Coherent  Affect:  anxious  Mood:  anxious (improved)  Thought process:  goal directed  Thought content:    WNL  Sensory/Perceptual disturbances:    WNL  Orientation:  oriented to person, place, time/date, situation, day of week, month of year and year  Attention:  Good  Concentration:  Good  Memory:  WNL  Fund of knowledge:   Good  Insight:    Good  Judgment:   Good  Impulse Control:  Good   Risk Assessment: Danger to Self:  No Self-injurious Behavior: No Danger to Others: No Duty to Warn:no Physical Aggression / Violence:No  Access to Firearms a concern: No  Gang Involvement:No   Subjective:  Patient in today with some improvement in her anxiety and depression. States she quit her job recently as she felt that was holding her back and was not a healthy environment for her.  Interventions: Cognitive Behavioral Therapy and Ego-Supportive  Diagnosis:   ICD-10-CM   1. Major depressive disorder, recurrent episode, moderate ([place])  F33.1      Plan of Care: Patient not signing tx plan on computer screen due to [place].  Treatment Goals: Goals remain on tx plan as patient works on strategies to reach her goals.  Long term goal: Appropriately grieve the loss of her mother to normalize mood and reach a level of adaptive functioning where daily life is not disrupted.  Short term goal: Verbalize any unresolved grief issues that may be contributing to depression.  Strategy: Explore the role of unresolved grief issues as they relate to patient's current depression.  Progress: Patient in today with anxiety,  which is decreasing some.  Grief lessened a "but still working on it". Feels tired, but less empty.  Still feels sad at times with a heavy loss. Talked through more of her grief today with sadness and trying to accept all the changes involved. Focused on really difficult memories of when mom was in hospice and "it was not peaceful!"  Tearfully processed her pain and stated that she has needed to do this for some time but was trying not upset dad as he is very non-emotional. "I'm looking forward to the time I hear others talking about feeling their deceased loved ones' spirit is still with them."  Discussed this a bit and how different people interpret their grief experiences differently and while there may be some similarities, there are also unique differences, which seemed to help patient.  Goal review and progress/strengths noted with patient.    Next appt within 2 weeks.   Shanon Ace, LCSW

## 2019-04-04 ENCOUNTER — Ambulatory Visit (INDEPENDENT_AMBULATORY_CARE_PROVIDER_SITE_OTHER): Payer: PRIVATE HEALTH INSURANCE | Admitting: Psychiatry

## 2019-04-04 ENCOUNTER — Other Ambulatory Visit: Payer: Self-pay

## 2019-04-04 DIAGNOSIS — F331 Major depressive disorder, recurrent, moderate: Secondary | ICD-10-CM | POA: Diagnosis not present

## 2019-04-04 NOTE — Progress Notes (Signed)
      [facility] Counselor/Therapist Progress Note  Patient ID: Deborah Crawford, MRN: [id number],    Date: 04/04/2019  Time Spent: 60 minutes   11:00am to 12:00noon  Treatment Type: Individual Therapy  Reported Symptoms:  Anxious, depressed, tearful, feelings of grief more acute  Mental Status Exam:  Appearance:   Casual     Behavior:  Appropriate, Sharing and tearful  Motor:  Normal  Speech/Language:   Normal Rate  Affect:  Depressed, Tearful and anxious  Mood:  anxious, depressed and sad  Thought process:  goal directed  Thought content:    WNL  Sensory/Perceptual disturbances:    WNL  Orientation:  oriented to person, place, time/date, situation, day of week, month of year and year  Attention:  Good  Concentration:  Good  Memory:  WNL  Fund of knowledge:   Good  Insight:    Good  Judgment:   Good  Impulse Control:  Good   Risk Assessment: Danger to Self:  No Self-injurious Behavior: No Danger to Others: No Duty to Warn:no Physical Aggression / Violence:No  Access to Firearms a concern: No  Gang Involvement:No   Subjective:  Patient feeling like "I've gone backwards".  More tearful today, depressed, and anxious. Denies any SI. Holiday time seems to be contributing to her increased symptoms.  Her grief issues over mom's death.    Interventions: Cognitive Behavioral Therapy, Ego-Supportive and Grief Therapy  Diagnosis:   ICD-10-CM   1. Major depressive disorder, recurrent episode, moderate ([place])  F33.1      Plan of Care: Patient not signing tx plan on computer screen due to [place].  Treatment Goals: Goals remain on tx plan as patient works on strategies to reach her goals.  Long term goal: Appropriately grieve the loss of her mother to normalize mood and reach a level of adaptive functioning where daily life is not disrupted.  Short term goal: Verbalize any unresolved grief issues that may be contributing to depression.  Strategy: Explore the  role of unresolved grief issues as they relate to patient's current depression.  Progress: Patient in today with increased grief symptoms, depression, anxiety, anger, and sadness.  Is working hard on the death of her mom and is really strugging here at holiday time.  Denies any SI.  Processed at length, her sadness and grief.  Tired.  Meds may be helping some. Very  Consumed with her grief today so we used our time together to process the feeling stated at the beginning of this Progress section.  Grief has stirred up some issues from her past, plus she admits that she and her mom "were both very codependent."  Was extremely close to her mother and she did a good job today being very open about their relationship and what all it meant to her and how the loss of mom is affecting her in so many ways now.  Patient does have plans this Thanksgiving weekend for 2 friends to come over and spend time together.  Goal review and progress noted with patient.   Next appt within 2-3 weeks.   Shanon Ace, LCSW

## 2019-04-19 ENCOUNTER — Ambulatory Visit: Payer: PRIVATE HEALTH INSURANCE | Admitting: Adult Health

## 2019-04-19 ENCOUNTER — Telehealth: Payer: Self-pay | Admitting: Adult Health

## 2019-04-19 NOTE — Telephone Encounter (Signed)
Yes

## 2019-04-19 NOTE — Telephone Encounter (Signed)
Re: Latuda. Pt is in between insurance right now and currently has no coverage. Can she get samples of Latuda 20mg  until Jan.?

## 2019-04-20 NOTE — Telephone Encounter (Signed)
I have called pt and left message

## 2019-04-27 ENCOUNTER — Ambulatory Visit: Payer: PRIVATE HEALTH INSURANCE | Admitting: Psychiatry

## 2019-05-07 ENCOUNTER — Encounter: Payer: Self-pay | Admitting: Adult Health

## 2019-05-07 ENCOUNTER — Ambulatory Visit (INDEPENDENT_AMBULATORY_CARE_PROVIDER_SITE_OTHER): Payer: PRIVATE HEALTH INSURANCE | Admitting: Adult Health

## 2019-05-07 ENCOUNTER — Other Ambulatory Visit: Payer: Self-pay

## 2019-05-07 ENCOUNTER — Ambulatory Visit (INDEPENDENT_AMBULATORY_CARE_PROVIDER_SITE_OTHER): Payer: PRIVATE HEALTH INSURANCE | Admitting: Psychiatry

## 2019-05-07 DIAGNOSIS — G4700 Insomnia, unspecified: Secondary | ICD-10-CM

## 2019-05-07 DIAGNOSIS — F332 Major depressive disorder, recurrent severe without psychotic features: Secondary | ICD-10-CM

## 2019-05-07 DIAGNOSIS — F331 Major depressive disorder, recurrent, moderate: Secondary | ICD-10-CM

## 2019-05-07 DIAGNOSIS — F509 Eating disorder, unspecified: Secondary | ICD-10-CM

## 2019-05-07 DIAGNOSIS — F4310 Post-traumatic stress disorder, unspecified: Secondary | ICD-10-CM

## 2019-05-07 DIAGNOSIS — F411 Generalized anxiety disorder: Secondary | ICD-10-CM | POA: Diagnosis not present

## 2019-05-07 MED ORDER — LISDEXAMFETAMINE DIMESYLATE 30 MG PO CAPS: 30.0000 mg | ORAL_CAPSULE | Freq: Every day | ORAL | 0 refills | Status: DC

## 2019-05-07 MED ORDER — LATUDA 20 MG PO TABS: ORAL_TABLET | ORAL | 1 refills | Status: DC

## 2019-05-07 NOTE — Progress Notes (Signed)
[facility] Counselor/Therapist Progress Note  Patient ID: Deborah Crawford, MRN: [id number],    Date: 05/07/2019  Time Spent: 60 minutes 1:00pm to 2:00pm  Treatment Type: Individual Therapy  Reported Symptoms: depression, anxiety, concerned after loss of her mom that her dad has just been diagnosed with lung cancer. Appt today with oncologist to determine more about his status.  Mental Status Exam:  Appearance:   Casual     Behavior:  Appropriate and Sharing  Motor:  Normal  Speech/Language:   Normal Rate  Affect:  Depressed and anxious, some tearfulness  Mood:  anxious and depressed  Thought process:  normal  Thought content:    WNL  Sensory/Perceptual disturbances:    WNL  Orientation:  oriented to person, place, time/date, situation, day of week, month of year and year  Attention:  Good  Concentration:  Good  Memory:  WNL  Fund of knowledge:   Good  Insight:    Good  Judgment:   Good  Impulse Control:  Fair   Risk Assessment: Danger to Self:  No Self-injurious Behavior: No Danger to Others: No Duty to Warn:no Physical Aggression / Violence:No  Access to Firearms a concern: No  Gang Involvement:No   Subjective: Patient in today.  Dad just diagnosed with lung cancer. 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 Is uncertain as to what she is going to do, depending on dad's prognosis. Feeling very responsible and needing to help with care of dad.  Not sure if she's going to move there to be with dad.  Some guilt issues. Still in touch with ex-boyfriend in [place], and adds that he has a drinking problem.   Interventions: Solution-Oriented/Positive Psychology, Ego-Supportive and Grief Therapy  Diagnosis:   ICD-10-CM   1. Major depressive disorder, recurrent episode, moderate ([place])  F33.1      Plan of Care: Patient not signing tx plan on computer screen due to [place].  Treatment Goals: Goals remain on tx plan as patient works on strategies to reach  her goals.  Long term goal: Appropriately grieve the loss of her mother to normalize mood and reach a level of adaptive functioning where daily life is not disrupted.Will be able to return to work and remain on her job 36-40 hrs a week.  Short term goal: Verbalize any unresolved grief issues that may be contributing to depression.  Strategy: Explore the role of unresolved grief issues as they relate to patient's current depression.  Progress: Patient in today with added stress (on top of managing her grief over mom's prior death) and sadness due to very recent diagnosis that father has lung cancer. Needed the session today to process her thoughts and feelings about this latest concerning news.  Tearful, tired, binge eating for comfort, angry, sad, still processing some of her unresolved grief of mom's death.  States that "I don't think my anxiety is quite as bad, I may just be feeling numb right now.  Sister, Maudie Mercury, is helpful but older sister is "not tuned in".  Emotionally confused and numb and has lot of uncertainty about her dad's situation and what she needs to do to be of help. He and another sister are at an oncology appt now to get more info about dad's situation and prognosis/recommendations. Patient to get info from sister later this afternoon.  Depression some more heightened but denies any SI. Realized also that right now there is lot of info re: dad's prognosis that she does not know.  Is trying to stay in the present and was some more calm and grounded by session end.  Is to check with front desk on appt with Yvette Rack , PMHNP,DNP for med check as she was already due for a re-check but had to change one earlier. Goal review and progress/efforts/challenges noted with patient.   Next appt within 1-2 weeks.   Mathis Fare, LCSW

## 2019-05-07 NOTE — Progress Notes (Signed)
Deborah Crawford [id number] Dec 19, 1965 53 y.o.  Subjective:   Patient ID:  Deborah Crawford is a 53 y.o. (DOB Dec 19, 1965) female.  Chief Complaint:  Chief Complaint  Patient presents with  . Depression  . Anxiety  . Eating Disorder  . Trauma  . Insomnia    HPI Deborah Crawford presents to the office today for follow-up of MDD, GAD, PTSD, insomnia, and Binge eating disorder.   Describes mood today as "ok". Pleasant. Decreased tearfulness. Mood symptoms - reports depression, anxiety, and irritability. Stating "things aren't going well". Father recently diagnosed with Lung cancer - given 3 months to live without treatment. Has a 30% chance of survival with treatment. Has started binge eating again - "I could do it every day". Currently doing it twice a week or more. Can't eat a "little" of anything - "I know it's not healthy, I just can't stop myself". Thyroid levels normal. Improved interest and motivation. Taking medications as prescribed.  Energy levels stable. Active, has a regular exercise routine. 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 Single - lives alone with dog poodle "koha".  Appetite increased. Weight gain - 10 to 15 pounds since last visit.  Sleeps well most nights.  Averages 6 to 8 hours.  Focus and concentration stable. Completing tasks. Managing aspects of household.  Denies SI or HI. Denies AH or VH.  Seeing Rockne Menghiniebbie Dowd for therapy.  Previous medications: Prozac-tired, Zoloft, Lexapro, Paxil, Effexor-SI, Wellbutrin-SI, Buspar-nightmares, Abilify - side effects.   Review of Systems:  Review of Systems  Musculoskeletal: Negative for gait problem.  Neurological: Negative for tremors.  Psychiatric/Behavioral:       Please refer to HPI    Medications: I have reviewed the patient's current medications.  Current Outpatient Medications  Medication Sig Dispense Refill  . doxycycline (PERIOSTAT) 20 MG tablet     .  DULoxetine (CYMBALTA) 60 MG capsule Take 1 capsule (60 mg total) by mouth daily. 90 capsule 1  . hydrOXYzine (ATARAX/VISTARIL) 25 MG tablet Take one tablet up to 4 times daily prn sleep/anxiety. 360 tablet 1  . levothyroxine (SYNTHROID, LEVOTHROID) 150 MCG tablet Take 1 tablet by mouth daily.    [name]. liothyronine (CYTOMEL) 5 MCG tablet Take 10 mcg by mouth daily.    [name]. lisdexamfetamine (VYVANSE) 30 MG capsule Take 1 capsule (30 mg total) by mouth daily. 30 capsule 0  . lurasidone (LATUDA) 20 MG TABS tablet Take one tablet at dinner with 350 calories. 90 tablet 1  . polyethylene glycol-electrolytes (NULYTELY/GOLYTELY) 420 g solution     . spironolactone (ALDACTONE) 50 MG tablet TK 1 T PO QD UTD    . SYNTHROID 112 MCG tablet Take 112 mcg by mouth daily.     No current facility-administered medications for this visit.    Medication Side Effects: None  Allergies:  Allergies  Allergen Reactions  . Oxycodone-Acetaminophen Other (See Comments)    Severe migraine  . Latex Other (See Comments), Itching and Rash    Past Medical History:  Diagnosis Date  . Anxiety   . Hand arthritis   . hypothyroidism   . Primary insomnia   . Retinal detachment     Family History  Problem Relation Age of Onset  . Esophageal cancer Mother   . Rashes / Skin problems Mother   . Rheum arthritis Mother   . Hypertension Father   . Hypertension Maternal Grandmother   . Hypertension Maternal Grandfather     Social History   Socioeconomic  History  . Marital status: Single    Spouse name: Not on file  . Number of children: 0  . Years of education: Not on file  . Highest education level: Not on file  Occupational History  . Occupation: mammogram tech    Employer: [employer]  Tobacco Use  . Smoking status: Never Smoker  . Smokeless tobacco: Never Used  Substance and Sexual Activity  . Alcohol use: Yes    Comment: Socially on the weekends  . Drug use: Never  . Sexual activity: Not on file   Other Topics Concern  . Not on file  Social History Narrative   Lives alone in a 2 story home.  No children.    Highest level of education:  Bachelors x 2   Social Determinants of Health   Financial Resource Strain:   . Difficulty of Paying Living Expenses: Not on file  Food Insecurity:   . Worried About Programme researcher, broadcasting/film/video in the Last Year: Not on file  . Ran Out of Food in the Last Year: Not on file  Transportation Needs:   . Lack of Transportation (Medical): Not on file  . Lack of Transportation (Non-Medical): Not on file  Physical Activity:   . Days of Exercise per Week: Not on file  . Minutes of Exercise per Session: Not on file  Stress:   . Feeling of Stress : Not on file  Social Connections:   . Frequency of Communication with Friends and Family: Not on file  . Frequency of Social Gatherings with Friends and Family: Not on file  . Attends Religious Services: Not on file  . Active Member of Clubs or Organizations: Not on file  . Attends Banker Meetings: Not on file  . Marital Status: Not on file  Intimate Partner Violence:   . Fear of Current or Ex-Partner: Not on file  . Emotionally Abused: Not on file  . Physically Abused: Not on file  . Sexually Abused: Not on file    Past Medical History, Surgical history, Social history, and Family history were reviewed and updated as appropriate.   Please see review of systems for further details on the patient's review from today.   Objective:   Physical Exam:  There were no vitals taken for this visit.  Physical Exam  Lab Review:  No results found for: NA, K, CL, CO2, GLUCOSE, BUN, CREATININE, CALCIUM, PROT, ALBUMIN, AST, ALT, ALKPHOS, BILITOT, GFRNONAA, GFRAA  No results found for: WBC, RBC, HGB, HCT, PLT, MCV, MCH, MCHC, RDW, LYMPHSABS, MONOABS, EOSABS, BASOSABS  No results found for: POCLITH, LITHIUM   No results found for: PHENYTOIN, PHENOBARB, VALPROATE, CBMZ   .res Assessment: Plan:     Plan:  1. Latuda 20mg  daily - will give samples #28 2. Hydroxyzine 25mg  tablet - 4 tablets daily prn 3. Cymbalta 60mg  daily 4. Add Vyvanse 30mg  daily for binge eating disorder - 132/68 72  Continue therapy -  RTC 4 weeks  Patient advised to contact office with any questions, adverse effects, or acute worsening in signs and symptoms.  Discussed potential metabolic side effects associated with atypical antipsychotics, as well as potential risk for movement side effects. Advised pt to contact office if movement side effects occur.    Lu was seen today for depression, anxiety, eating disorder, trauma and insomnia.  Diagnoses and all orders for this visit:  Severe episode of recurrent major depressive disorder, without psychotic features (HCC)  Generalized anxiety disorder  PTSD (post-traumatic stress disorder)  Insomnia, unspecified type  Eating disorder, unspecified type -     lisdexamfetamine (VYVANSE) 30 MG capsule; Take 1 capsule (30 mg total) by mouth daily.  Major depressive disorder, recurrent episode, moderate (HCC) -     lurasidone (LATUDA) 20 MG TABS tablet; Take one tablet at dinner with 350 calories.     Please see After Visit Summary for patient specific instructions.  Future Appointments  Date Time Provider [place]  05/24/2019 10:00 AM Shanon Ace, LCSW [facility] None  06/07/2019 10:00 AM Shanon Ace, LCSW [facility] None    No orders of the defined types were placed in this encounter.   -------------------------------

## 2019-05-09 ENCOUNTER — Encounter

## 2019-05-10 ENCOUNTER — Ambulatory Visit: Payer: PRIVATE HEALTH INSURANCE | Admitting: Psychiatry

## 2019-05-23 ENCOUNTER — Telehealth: Payer: Self-pay | Admitting: Adult Health

## 2019-05-23 NOTE — Telephone Encounter (Signed)
Patient called and said that the vyvanse script that you sent in was too expensive even with a coupon card. She didn't fill it. She would like to try something else that is less expensive, Please give her a call at [phone]

## 2019-05-24 ENCOUNTER — Ambulatory Visit: Payer: PRIVATE HEALTH INSURANCE | Admitting: Psychiatry

## 2019-06-06 ENCOUNTER — Ambulatory Visit (INDEPENDENT_AMBULATORY_CARE_PROVIDER_SITE_OTHER): Payer: PRIVATE HEALTH INSURANCE | Admitting: Adult Health

## 2019-06-06 ENCOUNTER — Other Ambulatory Visit: Payer: Self-pay

## 2019-06-06 ENCOUNTER — Encounter: Payer: Self-pay | Admitting: Adult Health

## 2019-06-06 DIAGNOSIS — F4310 Post-traumatic stress disorder, unspecified: Secondary | ICD-10-CM | POA: Diagnosis not present

## 2019-06-06 DIAGNOSIS — F411 Generalized anxiety disorder: Secondary | ICD-10-CM

## 2019-06-06 DIAGNOSIS — F509 Eating disorder, unspecified: Secondary | ICD-10-CM | POA: Diagnosis not present

## 2019-06-06 DIAGNOSIS — G4700 Insomnia, unspecified: Secondary | ICD-10-CM | POA: Diagnosis not present

## 2019-06-06 DIAGNOSIS — F331 Major depressive disorder, recurrent, moderate: Secondary | ICD-10-CM

## 2019-06-06 NOTE — Progress Notes (Signed)
Deborah Crawford [id number] [birth year] 54 y.o.  Subjective:   Patient ID:  Deborah Crawford is a 54 y.o. (DOB [birth year]) female.  Chief Complaint:  Chief Complaint  Patient presents with  . Anxiety  . Depression  . Other    Eating disorder  . Trauma  . Insomnia    HPI [name] presents to the office today for follow-up of MDD, GAD, PTSD, insomnia, and Binge eating disorder.   Describes mood today as "ok". Pleasant. Decreased tearfulness. Mood symptoms - reports decreased depression, anxiety, and irritability. Stating "I'm doing alright". Concerned about weight gain. Has joined a weight loss support group. Getting "pre-packaged foods". Has a sponsor. Still feels like she struggles with the "desire to eat or drink when she shouldn't". Father recently started chemo and radiation for lung cancer. Has started a new job 32-36 hours a week. Stating "it's giving me a "purpose". Has not see therapist lately. Improved interest and motivation. Taking medications as prescribed.  Energy levels stable. Active, has a regular exercise routine. 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 Single. Lives alone with dog poodle "koha". Has friends local. Appetite increased. Weight gain.  Sleeps well most nights.  Averages 6 to 8 hours.  Focus and concentration stable. Completing tasks. Managing aspects of household. Work going well. Denies SI or HI. Denies AH or VH.  Seeing Rinaldo Cloud for therapy.  Previous medications: Prozac-tired, Zoloft, Lexapro, Paxil, Effexor-SI, Wellbutrin-SI, Buspar-nightmares, Abilify - side effects.    Review of Systems:  Review of Systems  Musculoskeletal: Negative for gait problem.  Neurological: Negative for tremors.  Psychiatric/Behavioral:       Please refer to HPI    Medications: I have reviewed the patient's current medications.  Current Outpatient Medications  Medication Sig Dispense Refill  . doxycycline  (PERIOSTAT) 20 MG tablet     . DULoxetine (CYMBALTA) 60 MG capsule Take 1 capsule (60 mg total) by mouth daily. 90 capsule 1  . hydrOXYzine (ATARAX/VISTARIL) 25 MG tablet Take one tablet up to 4 times daily prn sleep/anxiety. 360 tablet 1  . levothyroxine (SYNTHROID, LEVOTHROID) 150 MCG tablet Take 1 tablet by mouth daily.    [name] liothyronine (CYTOMEL) 5 MCG tablet Take 10 mcg by mouth daily.    [name] lisdexamfetamine (VYVANSE) 30 MG capsule Take 1 capsule (30 mg total) by mouth daily. 30 capsule 0  . lurasidone (LATUDA) 20 MG TABS tablet Take one tablet at dinner with 350 calories. 90 tablet 1  . polyethylene glycol-electrolytes (NULYTELY/GOLYTELY) 420 g solution     . spironolactone (ALDACTONE) 50 MG tablet TK 1 T PO QD UTD    . SYNTHROID 112 MCG tablet Take 112 mcg by mouth daily.     No current facility-administered medications for this visit.    Medication Side Effects: None  Allergies:  Allergies  Allergen Reactions  . Oxycodone-Acetaminophen Other (See Comments)    Severe migraine  . Latex Other (See Comments), Itching and Rash    Past Medical History:  Diagnosis Date  . Anxiety   . Hand arthritis   . hypothyroidism   . Primary insomnia   . Retinal detachment     Family History  Problem Relation Age of Onset  . Esophageal cancer Mother   . Rashes / Skin problems Mother   . Rheum arthritis Mother   . Hypertension Father   . Hypertension Maternal Grandmother   . Hypertension Maternal Grandfather     Social History   Socioeconomic History  .  Marital status: Single    Spouse name: Not on file  . Number of children: 0  . Years of education: Not on file  . Highest education level: Not on file  Occupational History  . Occupation: mammogram tech    Employer: [employer]  Tobacco Use  . Smoking status: Never Smoker  . Smokeless tobacco: Never Used  Substance and Sexual Activity  . Alcohol use: Yes    Comment: Socially on the weekends  . Drug use: Never  .  Sexual activity: Not on file  Other Topics Concern  . Not on file  Social History Narrative   Lives alone in a 2 story home.  No children.    Highest level of education:  Bachelors x 2   Social Determinants of Health   Financial Resource Strain:   . Difficulty of Paying Living Expenses: Not on file  Food Insecurity:   . Worried About Programme researcher, broadcasting/film/video in the Last Year: Not on file  . Ran Out of Food in the Last Year: Not on file  Transportation Needs:   . Lack of Transportation (Medical): Not on file  . Lack of Transportation (Non-Medical): Not on file  Physical Activity:   . Days of Exercise per Week: Not on file  . Minutes of Exercise per Session: Not on file  Stress:   . Feeling of Stress : Not on file  Social Connections:   . Frequency of Communication with Friends and Family: Not on file  . Frequency of Social Gatherings with Friends and Family: Not on file  . Attends Religious Services: Not on file  . Active Member of Clubs or Organizations: Not on file  . Attends Banker Meetings: Not on file  . Marital Status: Not on file  Intimate Partner Violence:   . Fear of Current or Ex-Partner: Not on file  . Emotionally Abused: Not on file  . Physically Abused: Not on file  . Sexually Abused: Not on file    Past Medical History, Surgical history, Social history, and Family history were reviewed and updated as appropriate.   Please see review of systems for further details on the patient's review from today.   Objective:   Physical Exam:  There were no vitals taken for this visit.  Physical Exam Constitutional:      General: She is not in acute distress.    Appearance: She is well-developed.  Musculoskeletal:        General: No deformity.  Neurological:     Mental Status: She is alert and oriented to person, place, and time.     Coordination: Coordination normal.  Psychiatric:        Attention and Perception: Attention and perception normal. She does  not perceive auditory or visual hallucinations.        Mood and Affect: Mood normal. Mood is not anxious or depressed. Affect is not labile, blunt, angry or inappropriate.        Speech: Speech normal.        Behavior: Behavior normal.        Thought Content: Thought content normal. Thought content is not paranoid or delusional. Thought content does not include homicidal or suicidal ideation. Thought content does not include homicidal or suicidal plan.        Cognition and Memory: Cognition and memory normal.        Judgment: Judgment normal.     Comments: Insight intact     Lab Review:  No  results found for: NA, K, CL, CO2, GLUCOSE, BUN, CREATININE, CALCIUM, PROT, ALBUMIN, AST, ALT, ALKPHOS, BILITOT, GFRNONAA, GFRAA  No results found for: WBC, RBC, HGB, HCT, PLT, MCV, MCH, MCHC, RDW, LYMPHSABS, MONOABS, EOSABS, BASOSABS  No results found for: POCLITH, LITHIUM   No results found for: PHENYTOIN, PHENOBARB, VALPROATE, CBMZ   .res Assessment: Plan:    Plan:  1. Latuda 20mg  daily - will give samples #28 2. Hydroxyzine 25mg  tablet - 4 tablets daily prn 3. Cymbalta 60mg  daily 4. Add Topamax 25mg  - 1 at hs x 7 days, and then 2 daily.  Continue therapy -  RTC 4 weeks  Patient advised to contact office with any questions, adverse effects, or acute worsening in signs and symptoms.  Discussed potential metabolic side effects associated with atypical antipsychotics, as well as potential risk for movement side effects. Advised pt to contact office if movement side effects occur.   Rhealynn was seen today for anxiety, depression, other, trauma and insomnia.  Diagnoses and all orders for this visit:  Major depressive disorder, recurrent episode, moderate (HCC)  Eating disorder, unspecified type  Insomnia, unspecified type  PTSD (post-traumatic stress disorder)  Generalized anxiety disorder     Please see After Visit Summary for patient specific instructions.  No  future appointments.  No orders of the defined types were placed in this encounter.   -------------------------------

## 2019-06-07 ENCOUNTER — Ambulatory Visit: Payer: PRIVATE HEALTH INSURANCE | Admitting: Psychiatry

## 2019-06-07 ENCOUNTER — Other Ambulatory Visit: Payer: Self-pay

## 2019-06-07 ENCOUNTER — Telehealth: Payer: Self-pay | Admitting: Adult Health

## 2019-06-07 MED ORDER — TOPIRAMATE 25 MG PO TABS: ORAL_TABLET | ORAL | 0 refills | Status: DC

## 2019-06-07 NOTE — Telephone Encounter (Signed)
Noted, per office visit patient to start Topamax 25 mg 1 at bedtime for 7 days, then increase to 2 tablets (50 mg) at bedtime. #60 submitted to [facility] per request.

## 2019-06-07 NOTE — Telephone Encounter (Signed)
Pt in office yesterday. Left message stating Topamax was not sent in to [facility].

## 2019-06-20 ENCOUNTER — Other Ambulatory Visit: Payer: Self-pay

## 2019-06-20 ENCOUNTER — Ambulatory Visit (INDEPENDENT_AMBULATORY_CARE_PROVIDER_SITE_OTHER): Payer: BC Managed Care – PPO | Admitting: Psychiatry

## 2019-06-20 DIAGNOSIS — F331 Major depressive disorder, recurrent, moderate: Secondary | ICD-10-CM

## 2019-06-20 NOTE — Progress Notes (Signed)
[facility] Counselor/Therapist Progress Note  Patient ID: Deborah Crawford, MRN: [id number],    Date: 06/20/2019  Time Spent: 60 minutes   7:58am to 8:58am  Treatment Type: Individual Therapy  Reported Symptoms: grief, anxiety, depression (has improved some--is now moderate versus severe), some anger and resentment (decreasing and not as consuming)  Mental Status Exam:  Appearance:   Casual     Behavior:  Appropriate and Sharing  Motor:  Normal  Speech/Language:   Normal Rate  Affect:  anxiety, some depression  Mood:  anxious and depressed  Thought process:  normal  Thought content:    WNL  Sensory/Perceptual disturbances:    WNL  Orientation:  oriented to person, place, time/date, situation, day of week, month of year and year  Attention:  Good  Concentration:  Good  Memory:  WNL  Fund of knowledge:   Good  Insight:    Good  Judgment:   Good  Impulse Control:  Good/Fair   Risk Assessment: Danger to Self:  No Self-injurious Behavior: No Danger to Others: No Duty to Warn:no Physical Aggression / Violence:No  Access to Firearms a concern: No  Gang Involvement:No   Subjective: Patient in today working further on unresolved grief issues of mom's death and now dad's recent cancer diagnosis is an additional grief issue. Has started a new job that seems to offer a healthier environment for patient. Issues with former workplace and patient is working to let go ot this.  Interventions: Cognitive Behavioral Therapy and Solution-Oriented/Positive Psychology  Diagnosis:   ICD-10-CM   1. Major depressive disorder, recurrent episode, moderate (HCC)  F33.1     Plan of Care: Patient not signing tx plan on computer screen due to [place].  Treatment Goals: Goals remain on tx plan as patient works on strategies to reach her goals.  Long term goal: Appropriately grieve the loss of her mother to normalize mood and reach a level of adaptive functioning where daily life is  not disrupted.Will be able to return to work and remain on her job 36-40 hrs a week.  Short term goal: Verbalize any unresolved grief issues that may be contributing to depression.  Strategy: Explore the role of unresolved grief issues as they relate to patient's current depression.  Progress: Patient in today working on triggers to her anger that lead her to feel more depressed at times.  Triggers include disrespect, not listening to me, dad dismissing me, being told I'm always wrong, relationship with dad. Processed these today and ways that they play out in her life right now. Looked at ways to catch her triggers sooner and be able to respond sooner. Also working to eat healthier and stop her weight gain, and is working with another person who is helping encourage her and add some accountability. Has lost approx 12 lbs. If finding that she is needing to be more careful with her spending.  Able to see the connection with each of these behaviors to her emotional symptoms of grief, anxiety, and depression even though some symptoms are improving.  Is going to work more on setting some limits to her spending, eating habits, and her self-talk (working to make self-talk more positive).  Also paying attention to her thoughts, particularly anxious and negative thoughts and intercept them, while working to replace them with more positive and reality-based thought patterns,as previously discussed.Goal review and progress noted with patient.   Next visit within3-4 weeks.   Mathis Fare, LCSW

## 2019-07-03 ENCOUNTER — Ambulatory Visit: Payer: BC Managed Care – PPO | Admitting: Psychiatry

## 2019-07-17 ENCOUNTER — Ambulatory Visit: Payer: BC Managed Care – PPO | Admitting: Psychiatry

## 2019-07-25 ENCOUNTER — Ambulatory Visit: Payer: BC Managed Care – PPO | Admitting: Psychiatry

## 2019-08-03 ENCOUNTER — Ambulatory Visit: Payer: PRIVATE HEALTH INSURANCE | Attending: Internal Medicine

## 2019-08-03 DIAGNOSIS — Z23 Encounter for immunization: Secondary | ICD-10-CM

## 2019-08-03 NOTE — Progress Notes (Signed)
   Covid-19 Vaccination Clinic  Name:  Deborah Crawford    MRN: [id number] DOB: 1965-08-14  08/03/2019  Ms. Purnell was observed post Covid-19 immunization for 15 minutes without incident. She was provided with Vaccine Information Sheet and instruction to access the V-Safe system.   Ms. Evon was instructed to call 911 with any severe reactions post vaccine: [name] Difficulty breathing  . Swelling of face and throat  . A fast heartbeat  . A bad rash all over body  . Dizziness and weakness   Immunizations Administered    Name Date Dose VIS Date Route   Pfizer COVID-19 Vaccine 08/03/2019  9:55 AM 0.3 mL 04/20/2019 Intramuscular   Manufacturer: [employer], [employer]   Lot: [id number]   NDC: [address]-9

## 2019-08-22 ENCOUNTER — Ambulatory Visit (INDEPENDENT_AMBULATORY_CARE_PROVIDER_SITE_OTHER): Payer: BC Managed Care – PPO | Admitting: Psychiatry

## 2019-08-22 ENCOUNTER — Other Ambulatory Visit: Payer: Self-pay

## 2019-08-22 DIAGNOSIS — F331 Major depressive disorder, recurrent, moderate: Secondary | ICD-10-CM | POA: Diagnosis not present

## 2019-08-22 NOTE — Progress Notes (Signed)
[facility] Counselor/Therapist Progress Note  Patient ID: CHITARA CLONCH, MRN: [id number],    Date: 08/22/2019  Time Spent: 60 minutes  9:00am to 10:00am  Treatment Type: Individual Therapy  Reported Symptoms: anxiety, depression (both are decreased significantly), still feeling lonely but not as often,    Mental Status Exam:  Appearance:   Casual     Behavior:  Appropriate, Sharing and Motivated  Motor:  Normal  Speech/Language:   Normal Rate  Affect:  Appropriate  Mood:  euthymic  Thought process:  normal  Thought content:    WNL  Sensory/Perceptual disturbances:    WNL  Orientation:  oriented to person, place, time/date, situation, day of week, month of year and year  Attention:  Good  Concentration:  Good  Memory:  WNL  Fund of knowledge:   Good  Insight:    Good  Judgment:   Good  Impulse Control:  Good   Risk Assessment: Danger to Self:  No Self-injurious Behavior: No Danger to Others: No Duty to Warn:no Physical Aggression / Violence:No  Access to Firearms a concern: No  Gang Involvement:No   Subjective: Patient in today feeling better and proud of herself. Symptoms have all decreased and she has worked hard on therapy goals.  Feeling more confident and showing more self-care. Denies any SI.  Interventions: Cognitive Behavioral Therapy  Diagnosis:   ICD-10-CM   1. Major depressive disorder, recurrent episode, moderate ([place])  F33.1     Plan of Care: Patient not signing tx plan on computer screen due to [place].  Treatment Goals: Goals remain on tx plan as patient works on strategies to reach her goals.  Long term goal: Appropriately grieve the loss of her mother to normalize mood and reach a level of adaptive functioning where daily life is not disrupted.Will be able to return to work and remain on her job 36-40 hrs a week.  Short term goal: Verbalize any unresolved grief issues that may be contributing to  depression.  Strategy: Explore the role of unresolved grief issues as they relate to patient's current depression.  Progress: Patient in today reporting that her depression and anxiety has decreased, and some decrease in her sadness and anger.  Learning better how to live alone without as much loneliness. Not needing to control things as much.  Staying on healthier diet and has lost about 32lls since first of the year. Feeling much better in some ways. Conversation with dad since last appt wasn't very affirming and in some ways was hurtful and helped patient see her need to have better boundaries with dad. Feeling less emotional at times and handling things better, but more separated from sisters, except for [place]. Wants to stay focused on my mental and physical health. Understanding better the things that she can't change within the family and seems to be managing that better, managing grief better and it has lessened, and feeling more positive about herself. Is more positive over-all and especially in her self-talk. Is planning to take week of vacation close to July 4th holiday. Is working full time as Hydrologist and has worked well on the grief after loss of her mom. Is feeling more strength and  we agreed since she has met her therapy goals, she doesn't need to return for treatment at this point. Will call if needed and is following up with [facility] provider as she remains on her medication that has been helpfu.  Goal review and progress noted with patient.  Next visit within 1 month.   Shanon Ace, LCSW

## 2019-08-22 NOTE — Addendum Note (Signed)
Addended byMathis Fare on: 08/22/2019 09:58 AM   Modules accepted: Level of Service

## 2019-08-29 ENCOUNTER — Ambulatory Visit: Payer: PRIVATE HEALTH INSURANCE | Attending: Internal Medicine

## 2019-08-29 DIAGNOSIS — Z23 Encounter for immunization: Secondary | ICD-10-CM

## 2019-08-29 NOTE — Progress Notes (Signed)
   Covid-19 Vaccination Clinic  Name:  NATALYNN PEDONE    MRN: [id number] DOB: Jul 07, 1965  08/29/2019  Ms. Damico was observed post Covid-19 immunization for 15 minutes without incident. She was provided with Vaccine Information Sheet and instruction to access the V-Safe system.   Ms. Piercey was instructed to call 911 with any severe reactions post vaccine: [name] Difficulty breathing  . Swelling of face and throat  . A fast heartbeat  . A bad rash all over body  . Dizziness and weakness   Immunizations Administered    Name Date Dose VIS Date Route   Pfizer COVID-19 Vaccine 08/29/2019  2:05 PM 0.3 mL 07/04/2018 Intramuscular   Manufacturer: [employer], [employer]   Lot: [id number]   NDC: [address]-5

## 2019-09-04 ENCOUNTER — Ambulatory Visit: Payer: BC Managed Care – PPO | Admitting: Adult Health

## 2019-09-05 ENCOUNTER — Other Ambulatory Visit: Payer: Self-pay

## 2019-09-05 ENCOUNTER — Encounter: Payer: Self-pay | Admitting: Adult Health

## 2019-09-05 ENCOUNTER — Ambulatory Visit (INDEPENDENT_AMBULATORY_CARE_PROVIDER_SITE_OTHER): Payer: BC Managed Care – PPO | Admitting: Adult Health

## 2019-09-05 DIAGNOSIS — F509 Eating disorder, unspecified: Secondary | ICD-10-CM

## 2019-09-05 DIAGNOSIS — F331 Major depressive disorder, recurrent, moderate: Secondary | ICD-10-CM

## 2019-09-05 DIAGNOSIS — F411 Generalized anxiety disorder: Secondary | ICD-10-CM

## 2019-09-05 DIAGNOSIS — G4700 Insomnia, unspecified: Secondary | ICD-10-CM

## 2019-09-05 DIAGNOSIS — F4310 Post-traumatic stress disorder, unspecified: Secondary | ICD-10-CM

## 2019-09-05 MED ORDER — DULOXETINE HCL 60 MG PO CPEP: 60.0000 mg | ORAL_CAPSULE | Freq: Every day | ORAL | 1 refills | Status: DC

## 2019-09-05 MED ORDER — HYDROXYZINE HCL 25 MG PO TABS: ORAL_TABLET | ORAL | 1 refills | Status: DC

## 2019-09-05 NOTE — Progress Notes (Signed)
Deborah Crawford [id number] 1965-05-15 54 y.o.  Subjective:   Patient ID:  Deborah Crawford is a 54 y.o. (DOB 1966-04-29) female.  Chief Complaint: No chief complaint on file.   HPI Deborah Crawford presents to the office today for follow-up of MDD, GAD, PTSD, insomnia, and Binge eating disorder.   Describes mood today as "ok". Pleasant. Decreased tearfulness. Mood symptoms - reports decreased depression, anxiety, and irritability. Stating "I'm doing good". Has stopped Latuda and Topamax since last visit. Doing "well" without it. Has been working on weight loss. Father - lung cancer - "scans clear". Thyroid "regulated". Stable interest and motivation. Taking medications as prescribed.  Energy levels stable. Active, does not have a regular exercise routine. Walking dog. 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 Single. Lives alone with dog poodle "koha". Seeing friends.  Appetite increased. Weight loss 36 pounds - 1000 calorie diet. No longer binge eating.  Sleeps well most nights. Averages 6 to 8 hours.  Focus and concentration stable. Completing tasks. Managing aspects of household. Work going well. Denies SI or HI. Denies AH or VH. Seeing Rinaldo Cloud for therapy.  Previous medications: Prozac-tired, Zoloft, Lexapro, Paxil, Effexor-SI, Wellbutrin-SI, Buspar-nightmares, Abilify - side effects.    Review of Systems:  Review of Systems  Musculoskeletal: Negative for gait problem.  Neurological: Negative for tremors.  Psychiatric/Behavioral:       Please refer to HPI    Medications: I have reviewed the patient's current medications.  Current Outpatient Medications  Medication Sig Dispense Refill  . B Complex Vitamins (VITAMIN B COMPLEX) TABS Take by mouth.    . Biotin 1 MG CAPS Take by mouth.    . Cholecalciferol 50 MCG (2000 UT) CAPS Take by mouth.    . doxycycline (PERIOSTAT) 20 MG tablet     . DULoxetine (CYMBALTA) 60 MG capsule Take  1 capsule (60 mg total) by mouth daily. 90 capsule 1  . hydrOXYzine (ATARAX/VISTARIL) 25 MG tablet Take one tablet up to 4 times daily prn sleep/anxiety. 360 tablet 1  . levothyroxine (SYNTHROID, LEVOTHROID) 150 MCG tablet Take 1 tablet by mouth daily.    [name] liothyronine (CYTOMEL) 5 MCG tablet Take 10 mcg by mouth daily.    . Multiple Vitamin (MULTI-VITAMIN) tablet Take by mouth.    . polyethylene glycol-electrolytes (NULYTELY/GOLYTELY) 420 g solution     . spironolactone (ALDACTONE) 50 MG tablet TK 1 T PO QD UTD    . SYNTHROID 100 MCG tablet Take 100 mcg by mouth every morning.    [name] SYNTHROID 112 MCG tablet Take 112 mcg by mouth daily.     No current facility-administered medications for this visit.    Medication Side Effects: None  Allergies:  Allergies  Allergen Reactions  . Oxycodone-Acetaminophen Other (See Comments)    Severe migraine  . Mangifera Indica Other (See Comments)    Lips tingle  . Latex Other (See Comments), Itching and Rash    Past Medical History:  Diagnosis Date  . Anxiety   . Hand arthritis   . hypothyroidism   . Primary insomnia   . Retinal detachment     Family History  Problem Relation Age of Onset  . Esophageal cancer Mother   . Rashes / Skin problems Mother   . Rheum arthritis Mother   . Hypertension Father   . Hypertension Maternal Grandmother   . Hypertension Maternal Grandfather     Social History   Socioeconomic History  . Marital status: Single  Spouse name: Not on file  . Number of children: 0  . Years of education: Not on file  . Highest education level: Not on file  Occupational History  . Occupation: mammogram tech    Employer: [employer]  Tobacco Use  . Smoking status: Never Smoker  . Smokeless tobacco: Never Used  Substance and Sexual Activity  . Alcohol use: Yes    Comment: Socially on the weekends  . Drug use: Never  . Sexual activity: Not on file  Other Topics Concern  . Not on file  Social History  Narrative   Lives alone in a 2 story home.  No children.    Highest level of education:  Bachelors x 2   Social Determinants of Health   Financial Resource Strain:   . Difficulty of Paying Living Expenses:   Food Insecurity:   . Worried About Programme researcher, broadcasting/film/video in the Last Year:   . Barista in the Last Year:   Transportation Needs:   . Freight forwarder (Medical):   [name] Lack of Transportation (Non-Medical):   Physical Activity:   . Days of Exercise per Week:   . Minutes of Exercise per Session:   Stress:   . Feeling of Stress :   Social Connections:   . Frequency of Communication with Friends and Family:   . Frequency of Social Gatherings with Friends and Family:   . Attends Religious Services:   . Active Member of Clubs or Organizations:   . Attends Banker Meetings:   [name] Marital Status:   Intimate Partner Violence:   . Fear of Current or Ex-Partner:   . Emotionally Abused:   [name] Physically Abused:   . Sexually Abused:     Past Medical History, Surgical history, Social history, and Family history were reviewed and updated as appropriate.   Please see review of systems for further details on the patient's review from today.   Objective:   Physical Exam:  There were no vitals taken for this visit.  Physical Exam  Lab Review:  No results found for: NA, K, CL, CO2, GLUCOSE, BUN, CREATININE, CALCIUM, PROT, ALBUMIN, AST, ALT, ALKPHOS, BILITOT, GFRNONAA, GFRAA  No results found for: WBC, RBC, HGB, HCT, PLT, MCV, MCH, MCHC, RDW, LYMPHSABS, MONOABS, EOSABS, BASOSABS  No results found for: POCLITH, LITHIUM   No results found for: PHENYTOIN, PHENOBARB, VALPROATE, CBMZ   .res Assessment: Plan:    Plan:  1. Cymbalta 60mg  daily 2. Hydroxyzine 25mg  tablet - 4 tablets daily prn  Continue therapy -  RTC 6 months  Patient advised to contact office with any questions, adverse effects, or acute worsening in signs and  symptoms.   Diagnoses and all orders for this visit:  Major depressive disorder, recurrent episode, moderate (H[facility]) -     DULoxetine (CYMBALTA) 60 MG capsule; Take 1 capsule (60 mg total) by mouth daily.  Eating disorder, unspecified type  Insomnia, unspecified type -     hydrOXYzine (ATARAX/VISTARIL) 25 MG tablet; Take one tablet up to 4 times daily prn sleep/anxiety.  Generalized anxiety disorder -     DULoxetine (CYMBALTA) 60 MG capsule; Take 1 capsule (60 mg total) by mouth daily. -     hydrOXYzine (ATARAX/VISTARIL) 25 MG tablet; Take one tablet up to 4 times daily prn sleep/anxiety.  PTSD (post-traumatic stress disorder) -     DULoxetine (CYMBALTA) 60 MG capsule; Take 1 capsule (60 mg total) by mouth daily.  Please see After Visit Summary for patient specific instructions.  No future appointments.  No orders of the defined types were placed in this encounter.   -------------------------------

## 2019-09-07 ENCOUNTER — Telehealth: Payer: Self-pay | Admitting: Adult Health

## 2019-09-07 NOTE — Telephone Encounter (Signed)
Pt called and stated that since her insurance has changed her HYDDROXYZINE is now $115.00 (With Insurance). She is asking if maybe she can get a generic form of XANAX sent in.

## 2019-09-10 ENCOUNTER — Telehealth: Payer: Self-pay | Admitting: Adult Health

## 2019-09-10 ENCOUNTER — Other Ambulatory Visit: Payer: Self-pay

## 2019-09-10 DIAGNOSIS — G4700 Insomnia, unspecified: Secondary | ICD-10-CM

## 2019-09-10 DIAGNOSIS — F411 Generalized anxiety disorder: Secondary | ICD-10-CM

## 2019-09-10 MED ORDER — HYDROXYZINE HCL 25 MG PO TABS: ORAL_TABLET | ORAL | 1 refills | Status: DC

## 2019-09-10 NOTE — Telephone Encounter (Signed)
Patient returned call from Friday.

## 2019-09-10 NOTE — Telephone Encounter (Signed)
Noted  

## 2019-09-10 NOTE — Telephone Encounter (Signed)
Spoke with patient and explained Good [facility], she agreed. Submitted a 90 day supply to [facility]. [id number] for $26.66 using [facility].

## 2019-10-24 ENCOUNTER — Telehealth: Payer: Self-pay | Admitting: Adult Health

## 2019-10-24 NOTE — Telephone Encounter (Signed)
Pt reports in VM message that she is having low mood and a little more depressed. She wishes to increase her Cymbalta or see if Deborah Crawford is something that could help? Asks if Deborah Crawford is something that she would take every day or just prn?

## 2019-10-24 NOTE — Telephone Encounter (Signed)
She was taking Latuda and Topamax but had stopped it at last visit. She can restart at 20mg  daily.

## 2019-10-25 ENCOUNTER — Other Ambulatory Visit: Payer: Self-pay

## 2019-10-25 DIAGNOSIS — F411 Generalized anxiety disorder: Secondary | ICD-10-CM

## 2019-10-25 DIAGNOSIS — F4310 Post-traumatic stress disorder, unspecified: Secondary | ICD-10-CM

## 2019-10-25 DIAGNOSIS — F331 Major depressive disorder, recurrent, moderate: Secondary | ICD-10-CM

## 2019-10-25 MED ORDER — DULOXETINE HCL 30 MG PO CPEP: 90.0000 mg | ORAL_CAPSULE | Freq: Every day | ORAL | 0 refills | Status: DC

## 2019-10-25 NOTE — Telephone Encounter (Signed)
Rx sent for Duloxetine 30 mg take 3 capsules daily for 90 mg/day.  Patient pays out of pocket with [facility].

## 2019-10-25 NOTE — Telephone Encounter (Signed)
Ok to go up on 90mg  of the Cymbalta.

## 2020-02-01 ENCOUNTER — Other Ambulatory Visit: Payer: Self-pay | Admitting: Adult Health

## 2020-02-05 ENCOUNTER — Other Ambulatory Visit: Payer: Self-pay

## 2020-02-05 ENCOUNTER — Telehealth: Payer: Self-pay | Admitting: Adult Health

## 2020-02-05 MED ORDER — DULOXETINE HCL 30 MG PO CPEP: 90.0000 mg | ORAL_CAPSULE | Freq: Every day | ORAL | 0 refills | Status: DC

## 2020-02-05 NOTE — Telephone Encounter (Signed)
Pt called to report [facility] will not have Cymbalta in until Friday and Pt out now. Please send to [facility] [place] on file  this Rx only for 90 day.

## 2020-02-05 NOTE — Telephone Encounter (Signed)
Rx sent to [facility]

## 2020-02-29 ENCOUNTER — Ambulatory Visit: Payer: BC Managed Care – PPO | Admitting: Psychiatry

## 2020-03-06 ENCOUNTER — Ambulatory Visit: Payer: BC Managed Care – PPO | Admitting: Adult Health

## 2020-03-06 ENCOUNTER — Ambulatory Visit: Payer: BC Managed Care – PPO | Admitting: Psychiatry

## 2020-03-14 ENCOUNTER — Encounter: Payer: Self-pay | Admitting: Adult Health

## 2020-03-14 ENCOUNTER — Ambulatory Visit (INDEPENDENT_AMBULATORY_CARE_PROVIDER_SITE_OTHER): Payer: BC Managed Care – PPO | Admitting: Adult Health

## 2020-03-14 ENCOUNTER — Other Ambulatory Visit: Payer: Self-pay

## 2020-03-14 ENCOUNTER — Ambulatory Visit: Payer: BC Managed Care – PPO | Admitting: Psychiatry

## 2020-03-14 ENCOUNTER — Ambulatory Visit (INDEPENDENT_AMBULATORY_CARE_PROVIDER_SITE_OTHER): Payer: BC Managed Care – PPO | Admitting: Psychiatry

## 2020-03-14 DIAGNOSIS — G4700 Insomnia, unspecified: Secondary | ICD-10-CM

## 2020-03-14 DIAGNOSIS — F4310 Post-traumatic stress disorder, unspecified: Secondary | ICD-10-CM | POA: Diagnosis not present

## 2020-03-14 DIAGNOSIS — F509 Eating disorder, unspecified: Secondary | ICD-10-CM | POA: Diagnosis not present

## 2020-03-14 DIAGNOSIS — F331 Major depressive disorder, recurrent, moderate: Secondary | ICD-10-CM

## 2020-03-14 DIAGNOSIS — F411 Generalized anxiety disorder: Secondary | ICD-10-CM

## 2020-03-14 MED ORDER — DULOXETINE HCL 60 MG PO CPEP: 60.0000 mg | ORAL_CAPSULE | Freq: Two times a day (BID) | ORAL | 5 refills | Status: DC

## 2020-03-14 MED ORDER — ALPRAZOLAM 0.25 MG PO TABS: 0.2500 mg | ORAL_TABLET | Freq: Every day | ORAL | 2 refills | Status: DC | PRN

## 2020-03-14 NOTE — Progress Notes (Signed)
[name] [id number] 11-Jun-1965 54 y.o.  Subjective:   Patient ID:  Deborah Crawford is a 54 y.o. (DOB 1965-08-24) female.  Chief Complaint: No chief complaint on file.   HPI Deborah Crawford presents to the office today for follow-up of MDD, GAD, PTSD, insomnia, and Binge eating disorder.   Describes mood today as "ok". Pleasant. Decreased tearfulness. Mood symptoms - reports depression - very depressed and defeated, anxiety - coming in giant waves, and irritability - short tempered. Having panic attacks. Stating "I am frustrated with where I am right now". Feels like she worked very hard to get where she was and is disappointed with the decline. Felt "so hopeful" earlier in the summer. Father doing well physically - not a lot of communication between them. Stable interest and motivation. Taking medications as prescribed.  Energy levels stable. Active, does not have a regular exercise routine. Walking dog.  Enjoys some usual interests and activities. Single. Lives alone with dog poodle "koha". Mostly staying home.[name]  Appetite increased. Weight gain 10 pounds. Has started binge eating.  Sleeping difficulties. Averages 4 to 5 hours of broken sleep.  Focus and concentration stable. Completing tasks. Managing aspects of household. 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 Denies AH or VH. Seeing [name] for therapy.  Previous medications: Prozac-tired, Zoloft, Lexapro, Paxil, Effexor-SI, Wellbutrin-SI, Buspar-nightmares, Abilify - side effects.  Review of Systems:  Review of Systems  Musculoskeletal: Negative for gait problem.  Neurological: Negative for tremors.  Psychiatric/Behavioral:       Please refer to HPI    Medications: I have reviewed the patient's current medications.  Current Outpatient Medications  Medication Sig Dispense Refill  . ALPRAZolam (XANAX) 0.25 MG tablet Take 1 tablet (0.25 mg total) by mouth daily as needed for  anxiety. 30 tablet 2  . B Complex Vitamins (VITAMIN B COMPLEX) TABS Take by mouth.    . Biotin 1 MG CAPS Take by mouth.    . Cholecalciferol 50 MCG (2000 UT) CAPS Take by mouth.    . doxycycline (PERIOSTAT) 20 MG tablet     . DULoxetine (CYMBALTA) 60 MG capsule Take 1 capsule (60 mg total) by mouth 2 (two) times daily. 60 capsule 5  . hydrOXYzine (ATARAX/VISTARIL) 25 MG tablet Take one tablet up to 4 times daily prn sleep/anxiety. 360 tablet 1  . levothyroxine (SYNTHROID, LEVOTHROID) 150 MCG tablet Take 1 tablet by mouth daily.    [name] liothyronine (CYTOMEL) 5 MCG tablet Take 10 mcg by mouth daily.    . Multiple Vitamin (MULTI-VITAMIN) tablet Take by mouth.    . polyethylene glycol-electrolytes (NULYTELY/GOLYTELY) 420 g solution     . spironolactone (ALDACTONE) 50 MG tablet TK 1 T PO QD UTD    . SYNTHROID 100 MCG tablet Take 100 mcg by mouth every morning.    [name] SYNTHROID 112 MCG tablet Take 112 mcg by mouth daily.     No current facility-administered medications for this visit.    Medication Side Effects: None  Allergies:  Allergies  Allergen Reactions  . Oxycodone-Acetaminophen Other (See Comments)    Severe migraine  . Mangifera Indica Other (See Comments)    Lips tingle  . Latex Other (See Comments), Itching and Rash    Past Medical History:  Diagnosis Date  . Anxiety   . Hand arthritis   . hypothyroidism   . Primary insomnia   . Retinal detachment     Family History  Problem Relation Age of Onset  .  Esophageal cancer Mother   . Rashes / Skin problems Mother   . Rheum arthritis Mother   . Hypertension Father   . Hypertension Maternal Grandmother   . Hypertension Maternal Grandfather     Social History   Socioeconomic History  . Marital status: Single    Spouse name: Not on file  . Number of children: 0  . Years of education: Not on file  . Highest education level: Not on file  Occupational History  . Occupation: mammogram tech    Employer: [employer]   Tobacco Use  . Smoking status: Never Smoker  . Smokeless tobacco: Never Used  Vaping Use  . Vaping Use: Never used  Substance and Sexual Activity  . Alcohol use: Yes    Comment: Socially on the weekends  . Drug use: Never  . Sexual activity: Not on file  Other Topics Concern  . Not on file  Social History Narrative   Lives alone in a 2 story home.  No children.    Highest level of education:  Bachelors x 2   Social Determinants of Health   Financial Resource Strain:   . Difficulty of Paying Living Expenses: Not on file  Food Insecurity:   . Worried About Programme researcher, broadcasting/film/video in the Last Year: Not on file  . Ran Out of Food in the Last Year: Not on file  Transportation Needs:   . Lack of Transportation (Medical): Not on file  . Lack of Transportation (Non-Medical): Not on file  Physical Activity:   . Days of Exercise per Week: Not on file  . Minutes of Exercise per Session: Not on file  Stress:   . Feeling of Stress : Not on file  Social Connections:   . Frequency of Communication with Friends and Family: Not on file  . Frequency of Social Gatherings with Friends and Family: Not on file  . Attends Religious Services: Not on file  . Active Member of Clubs or Organizations: Not on file  . Attends Banker Meetings: Not on file  . Marital Status: Not on file  Intimate Partner Violence:   . Fear of Current or Ex-Partner: Not on file  . Emotionally Abused: Not on file  . Physically Abused: Not on file  . Sexually Abused: Not on file    Past Medical History, Surgical history, Social history, and Family history were reviewed and updated as appropriate.   Please see review of systems for further details on the patient's review from today.   Objective:   Physical Exam:  There were no vitals taken for this visit.  Physical Exam Constitutional:      General: She is not in acute distress. Musculoskeletal:        General: No deformity.  Neurological:      Mental Status: She is alert and oriented to person, place, and time.     Coordination: Coordination normal.  Psychiatric:        Attention and Perception: Attention and perception normal. She does not perceive auditory or visual hallucinations.        Mood and Affect: Mood is anxious and depressed. Affect is not labile, blunt, angry or inappropriate.        Speech: Speech normal.        Behavior: Behavior normal.        Thought Content: Thought content normal. Thought content is not paranoid or delusional. Thought content does not include homicidal or suicidal ideation. Thought content does not  include homicidal or suicidal plan.        Cognition and Memory: Cognition and memory normal.        Judgment: Judgment normal.     Comments: Insight intact     Lab Review:  No results found for: NA, K, CL, CO2, GLUCOSE, BUN, CREATININE, CALCIUM, PROT, ALBUMIN, AST, ALT, ALKPHOS, BILITOT, GFRNONAA, GFRAA  No results found for: WBC, RBC, HGB, HCT, PLT, MCV, MCH, MCHC, RDW, LYMPHSABS, MONOABS, EOSABS, BASOSABS  No results found for: POCLITH, LITHIUM   No results found for: PHENYTOIN, PHENOBARB, VALPROATE, CBMZ   .res Assessment: Plan:    Plan:  1. Increase Cymbalta 90mg  daily to 60mg  BID 2. Hydroxyzine 25mg  tablet - 4 tablets daily prn 3. Add Xanax 0.25mg  daily prn anxiety attacks  Will follow up with Endocrinology  Continue therapy -  RTC 6 months  Patient advised to contact office with any questions, adverse effects, or acute worsening in signs and symptoms.   Diagnoses and all orders for this visit:  Major depressive disorder, recurrent episode, moderate (HCC) -     DULoxetine (CYMBALTA) 60 MG capsule; Take 1 capsule (60 mg total) by mouth 2 (two) times daily.  Eating disorder, unspecified type -     DULoxetine (CYMBALTA) 60 MG capsule; Take 1 capsule (60 mg total) by mouth 2 (two) times daily.  Insomnia, unspecified type -     DULoxetine (CYMBALTA) 60 MG capsule;  Take 1 capsule (60 mg total) by mouth 2 (two) times daily. -     ALPRAZolam (XANAX) 0.25 MG tablet; Take 1 tablet (0.25 mg total) by mouth daily as needed for anxiety.  PTSD (post-traumatic stress disorder) -     DULoxetine (CYMBALTA) 60 MG capsule; Take 1 capsule (60 mg total) by mouth 2 (two) times daily.  Generalized anxiety disorder -     DULoxetine (CYMBALTA) 60 MG capsule; Take 1 capsule (60 mg total) by mouth 2 (two) times daily. -     ALPRAZolam (XANAX) 0.25 MG tablet; Take 1 tablet (0.25 mg total) by mouth daily as needed for anxiety.     Please see After Visit Summary for patient specific instructions.   Future Appointments  Date Time Provider Department Center  04/17/2020  4:00 PM , LCSW [facility] None  04/30/2020  4:00 PM 14/01/2020, LCSW [facility] None    No orders of the defined types were placed in this encounter.   -------------------------------

## 2020-03-14 NOTE — Progress Notes (Signed)
[facility] Counselor/Therapist Progress Note  Patient ID: Deborah Crawford, MRN: [id number],    Date: 03/14/2020  Time Spent: 60 minutes    8:00am to 9:00am   Treatment Type: Individual Therapy  Reported Symptoms: anxiety, depression, "defeated", "ups and downs and then things shift without explainable reasons", drinking alcohol increased and eating unhealthy and "normally that's not me", few good friends / alone a lot, mom deceased and dad not in contact much  Mental Status Exam:  Appearance:   Casual     Behavior:  Appropriate, Sharing and Motivated  Motor:  Normal  Speech/Language:   Clear and Coherent  Affect:  anxious, depressed  Mood:  anxious and depressed  Thought process:  goal directed  Thought content:    WNL  Sensory/Perceptual disturbances:    WNL  Orientation:  oriented to person, place, time/date, situation, day of week, month of year and year  Attention:  Good  Concentration:  Good  Memory:  WNL  Fund of knowledge:   Good  Insight:    Good  Judgment:   Good and Fair  Impulse Control:  Good and Fair   Risk Assessment: Danger to Self:  No Self-injurious Behavior: No Danger to Others: No Duty to Warn:no Physical Aggression / Violence:No  Access to Firearms a concern: No  Gang Involvement:No   Subjective: Patient reports today anxiety and depression worsened. Having "ups and downs". Not many supportive friends. Shifts in moods "without explainable reasons", drinking more and unhealthy eating.  At odds with some family members who are not supportive.  Still grieving some re: her mom's death.  Resents that other family members don't keep in touch much nor respond supportively.   Interventions: Solution-Oriented/Positive Psychology and Ego-Supportive  Diagnosis:   ICD-10-CM   1. Major depressive disorder, recurrent episode, moderate (HCC)  F33.1      Plan of Care: Patient not signing tx plan on computer screen due to COVID.  Treatment  Goals: Goals remain on tx plan as patient works on strategies to reach her goals.  Long term goal: Appropriately grieve the loss of her mother to normalize mood and reach a level of adaptive functioning where daily life is not disrupted.Will be able to return to work and remain on her job 36-40 hrs a week.  Short term goal: Verbalize any unresolved grief issues that may be contributing to depression.  Strategy: Explore the role of unresolved grief issues as they relate to patient's current depression.  Progress: Patient in today after not having been seen since April 2021.  Change in jobs, her COVID working hours, and other situations made it difficult for her to meet appointments. And she had made some progress earlier and remained on her medication. Feels so unsupported by others.  Ups and downs unexpectedly with mood.  Denies any SI. Still grieving her mom some as that was the main person she felt loved by. At odds with multiple family members who don't keep in touch much and are not supportive. Did vent a lot of anxious/painful thought and feelings today and we discussed action steps including returning to some journaling/talking points/or letters to self that has helped her in the past "empty out" some of her depressive and negative thoughts.  She is to work on this in between sessions.  Also encouraged patient to prep healthier nutrition and some exercise which she does enjoy walking her dog, focusing on what she can control versus cannot control, trying to stay in the present  right now versus the past nor the future, being more outreaching to other people that can be part of her support system, being more in touch with relatives who are healthier for her especially a cousin in [place], have more positive self talk, and develop the habit of looking for more positives than negatives each day.  Is to also see her med provider today regarding med eval and possible changes.   Goal review  and progress/challenges noted with patient.  Next appointment in 2-3 wks.   Mathis Fare, LCSW

## 2020-03-21 ENCOUNTER — Other Ambulatory Visit: Payer: Self-pay

## 2020-03-21 ENCOUNTER — Emergency Department (HOSPITAL_COMMUNITY)
Admission: EM | Admit: 2020-03-21 | Discharge: 2020-03-21 | Disposition: A | Discharge status: Home / Self Care | Payer: BC Managed Care – PPO | Attending: Emergency Medicine | Admitting: Emergency Medicine

## 2020-03-21 ENCOUNTER — Encounter (HOSPITAL_COMMUNITY): Payer: Self-pay | Admitting: Emergency Medicine

## 2020-03-21 DIAGNOSIS — Z91040 Latex allergy status: Secondary | ICD-10-CM | POA: Insufficient documentation

## 2020-03-21 DIAGNOSIS — Z23 Encounter for immunization: Secondary | ICD-10-CM | POA: Insufficient documentation

## 2020-03-21 DIAGNOSIS — Z79899 Other long term (current) drug therapy: Secondary | ICD-10-CM | POA: Diagnosis not present

## 2020-03-21 DIAGNOSIS — S0101XA Laceration without foreign body of scalp, initial encounter: Secondary | ICD-10-CM | POA: Diagnosis not present

## 2020-03-21 DIAGNOSIS — Y9342 Activity, yoga: Secondary | ICD-10-CM | POA: Insufficient documentation

## 2020-03-21 DIAGNOSIS — W228XXA Striking against or struck by other objects, initial encounter: Secondary | ICD-10-CM | POA: Diagnosis not present

## 2020-03-21 DIAGNOSIS — S0990XA Unspecified injury of head, initial encounter: Secondary | ICD-10-CM | POA: Diagnosis present

## 2020-03-21 DIAGNOSIS — E038 Other specified hypothyroidism: Secondary | ICD-10-CM | POA: Insufficient documentation

## 2020-03-21 MED ORDER — TETANUS-DIPHTH-ACELL PERTUSSIS 5-2.5-18.5 LF-MCG/0.5 IM SUSY
0.5000 mL | PREFILLED_SYRINGE | Freq: Once | INTRAMUSCULAR | Status: AC
  Administered 2020-03-21: 0.5 mL via INTRAMUSCULAR
  Filled 2020-03-21: qty 0.5

## 2020-03-21 MED ORDER — LIDOCAINE-EPINEPHRINE 1 %-1:100000 IJ SOLN
20.0000 mL | Freq: Once | INTRAMUSCULAR | Status: DC
  Filled 2020-03-21: qty 1

## 2020-03-21 NOTE — Discharge Instructions (Signed)
Schedule appointment with primary doctor for wound recheck and suture removal in 1 week.  If you develop redness, swelling, purulence, return to ER for reassessment.  Keep wound clean and dry, cover with gauze or Band-Aid.

## 2020-03-21 NOTE — ED Triage Notes (Signed)
Pt states while doing yoga this morning she fell off her head stand and hit her head on a wooden chair, laceration above R eye, bleeding controlled. Denies LOC or blood thinners. Last tetanus shot 1998.

## 2020-03-21 NOTE — ED Provider Notes (Signed)
[facility] EMERGENCY DEPARTMENT Provider Note   CSN: [id number] Arrival date & time: 03/21/20  2637     History Chief Complaint  Patient presents with  . Laceration  . Fall   Deborah Crawford is a 54 y.o. female.  Presents to ER for forehead laceration.  Reports that she was doing yoga when she was fell, hit her head on wooden chair, suffered laceration to her right forehead/right eyebrow.  No LOC, not on blood thinners.  Denies other trauma.  Last tetanus many years ago.  HPI     Past Medical History:  Diagnosis Date  . Anxiety   . Hand arthritis   . hypothyroidism   . Primary insomnia   . Retinal detachment     Patient Active Problem List   Diagnosis Date Noted  . Bilateral carpal tunnel syndrome 06/29/2018  . History of breast implant 05/26/2018  . Other specified hypothyroidism 07/08/2017  . Murmur 07/08/2017  . Palpitations 07/08/2017  . Tachycardia 07/08/2017    Past Surgical History:  Procedure Laterality Date  . BREAST REDUCTION SURGERY    . PLACEMENT OF BREAST IMPLANTS    . tummy tuck       OB History   No obstetric history on file.     Family History  Problem Relation Age of Onset  . Esophageal cancer Mother   . Rashes / Skin problems Mother   . Rheum arthritis Mother   . Hypertension Father   . Hypertension Maternal Grandmother   . Hypertension Maternal Grandfather     Social History   Tobacco Use  . Smoking status: Never Smoker  . Smokeless tobacco: Never Used  Vaping Use  . Vaping Use: Never used  Substance Use Topics  . Alcohol use: Yes    Comment: Socially on the weekends  . Drug use: Never    Home Medications Prior to Admission medications   Medication Sig Start Date End Date Taking? Authorizing Provider  ALPRAZolam (XANAX) 0.25 MG tablet Take 1 tablet (0.25 mg total) by mouth daily as needed for anxiety. 03/14/20   Mozingo, Thereasa Solo, NP  B Complex Vitamins (VITAMIN B COMPLEX) TABS Take by mouth.     [provider]  Biotin 1 MG CAPS Take by mouth.    [provider]  Cholecalciferol 50 MCG (2000 UT) CAPS Take by mouth.    [provider]  doxycycline (PERIOSTAT) 20 MG tablet  02/02/19   [provider]  DULoxetine (CYMBALTA) 60 MG capsule Take 1 capsule (60 mg total) by mouth 2 (two) times daily. 03/14/20   Mozingo, Thereasa Solo, NP  hydrOXYzine (ATARAX/VISTARIL) 25 MG tablet Take one tablet up to 4 times daily prn sleep/anxiety. 09/10/19   Mozingo, Thereasa Solo, NP  levothyroxine (SYNTHROID, LEVOTHROID) 150 MCG tablet Take 1 tablet by mouth daily. 05/30/18   [provider]  liothyronine (CYTOMEL) 5 MCG tablet Take 10 mcg by mouth daily. 04/30/19   [provider]  Multiple Vitamin (MULTI-VITAMIN) tablet Take by mouth.    [provider]  polyethylene glycol-electrolytes (NULYTELY/GOLYTELY) 420 g solution  02/01/19   [provider]  spironolactone (ALDACTONE) 50 MG tablet TK 1 T PO QD UTD 02/02/19   [provider]  SYNTHROID 100 MCG tablet Take 100 mcg by mouth every morning. 07/04/19   [provider]  SYNTHROID 112 MCG tablet Take 112 mcg by mouth daily. 05/06/19   [provider]    Allergies    Oxycodone-acetaminophen, Mangifera  indica, and Latex  Review of Systems   Review of Systems  Constitutional: Negative for activity change and fatigue.  Eyes: Negative for pain and visual disturbance.  Respiratory: Negative for cough and shortness of breath.   Cardiovascular: Negative for chest pain and palpitations.  Gastrointestinal: Negative for abdominal pain and vomiting.  Musculoskeletal: Negative for arthralgias and back pain.  Skin: Positive for wound. Negative for color change and rash.  Neurological: Negative for syncope.  All other systems reviewed and are negative.   Physical Exam Updated Vital Signs BP (!) 143/80 (BP Location: Left Arm)   Pulse (!) 115   Temp 97.9 F  (36.6 C) (Oral)   Resp 18   SpO2 95%   Physical Exam Vitals and nursing note reviewed.  Constitutional:      General: She is not in acute distress.    Appearance: She is well-developed.  HENT:     Head: Normocephalic.     Comments: 2cm laceration just superior and lateral to right orbit, bleeding controlled with direct pressure Eyes:     Conjunctiva/sclera: Conjunctivae normal.     Comments: No trauma, normal EOM, no hyphema, no conjunctival hemorrhage  Cardiovascular:     Rate and Rhythm: Normal rate and regular rhythm.     Heart sounds: No murmur heard.   Pulmonary:     Effort: Pulmonary effort is normal. No respiratory distress.     Breath sounds: Normal breath sounds.  Abdominal:     Palpations: Abdomen is soft.     Tenderness: There is no abdominal tenderness.  Musculoskeletal:     Cervical back: Neck supple.  Skin:    General: Skin is warm and dry.  Neurological:     Mental Status: She is alert.     ED Results / Procedures / Treatments   Labs (all labs ordered are listed, but only abnormal results are displayed) Labs Reviewed - No data to display  EKG None  Radiology No results found.  Procedures .Marland KitchenLaceration Repair  Date/Time: 03/21/2020 9:07 AM Performed by: Milagros Loll, MD Authorized by: Milagros Loll, MD   Consent:    Consent obtained:  Verbal   Consent given by:  Patient   Risks discussed:  Infection, nerve damage, need for additional repair, poor wound healing and poor cosmetic result   Alternatives discussed:  No treatment Anesthesia (see MAR for exact dosages):    Anesthesia method:  Local infiltration   Local anesthetic:  Lidocaine 2% WITH epi Laceration details:    Location:  Face   Face location:  R eyebrow   Length (cm):  2 Repair type:    Repair type:  Simple Exploration:    Hemostasis achieved with:  Direct pressure   Contaminated: no   Treatment:    Area cleansed with:  Saline   Amount of cleaning:  Extensive    Irrigation solution:  Sterile saline   Irrigation method:  Syringe Skin repair:    Repair method:  Sutures   Suture size:  5-0   Suture material:  Nylon   Number of sutures:  2 Approximation:    Approximation:  Close Post-procedure details:    Dressing:  Adhesive bandage   Patient tolerance of procedure:  Tolerated well, no immediate complications   (including critical care time)  Medications Ordered in ED Medications  lidocaine-EPINEPHrine (XYLOCAINE W/EPI) 1 %-1:100000 (with pres) injection 20 mL (has no administration in time range)  Tdap (BOOSTRIX) injection 0.5 mL (0.5 mLs Intramuscular Given 03/21/20 0853)  ED Course  I have reviewed the triage vital signs and the nursing notes.  Pertinent labs & imaging results that were available during my care of the patient were reviewed by me and considered in my medical decision making (see chart for details).    MDM Rules/Calculators/A&P                         54 year old lady presented to ER with right eyebrow/forehead laceration.  No trauma to eye.  Repaired at bedside.  Tolerated well, follow-up in 1 week with primary for wound recheck and suture removal.   After the discussed management above, the patient was determined to be safe for discharge.  The patient was in agreement with this plan and all questions regarding their care were answered.  ED return precautions were discussed and the patient will return to the ED with any significant worsening of condition.  Final Clinical Impression(s) / ED Diagnoses Final diagnoses:  Laceration of scalp, initial encounter    Rx / DC Orders ED Discharge Orders    None       Milagros Loll, MD 03/21/20 [phone]

## 2020-03-21 NOTE — ED Notes (Signed)
Patient verbalized understanding of discharge instructions. Opportunity for questions and answers.  °

## 2020-03-25 ENCOUNTER — Other Ambulatory Visit: Payer: Self-pay

## 2020-03-25 ENCOUNTER — Ambulatory Visit (INDEPENDENT_AMBULATORY_CARE_PROVIDER_SITE_OTHER): Payer: BC Managed Care – PPO | Admitting: Psychiatry

## 2020-03-25 DIAGNOSIS — F331 Major depressive disorder, recurrent, moderate: Secondary | ICD-10-CM

## 2020-03-25 NOTE — Progress Notes (Signed)
[facility] Counselor/Therapist Progress Note  Patient ID: Deborah Crawford, MRN: [id number],    Date: 03/25/2020  Time Spent: 60 minutes   3:00pm to 4:00pm  Treatment Type: Individual Therapy  Reported Symptoms: anxiety, depression, loneliness, overloaded  Mental Status Exam:  Appearance:   Neat     Behavior:  Appropriate, Sharing and Motivated  Motor:  Normal  Speech/Language:   Clear and Coherent  Affect:  anxious, depressed  Mood:  anxious and depressed  Thought process:  goal directed  Thought content:    WNL  Sensory/Perceptual disturbances:    WNL  Orientation:  oriented to person, place, time/date, situation, day of week, month of year and year  Attention:  Good  Concentration:  Good  Memory:  WNL  Fund of knowledge:   Good  Insight:    Good  Judgment:   Good  Impulse Control:  Fair   Risk Assessment: Danger to Self:  No Self-injurious Behavior: No Danger to Others: No Duty to Warn:no Physical Aggression / Violence:No  Access to Firearms a concern: No  Gang Involvement:No   Subjective: Patient today reports anxiety and depression.  Feels that she has dealt with some things better since getting back into therapy.  Feels a little more proactive but still feel low and defeated.  Interventions: Solution-Oriented/Positive Psychology and Ego-Supportive   Diagnosis:   ICD-10-CM   1. Major depressive disorder, recurrent episode, moderate (HCC)  F33.1        Plan of Care: Patient not signing tx plan on computer screen due to COVID.  Treatment Goals: Goals remain on tx plan as patient works on strategies to reach her goals.  Long term goal: Appropriately grieve the loss of her mother to normalize mood and reach a level of adaptive functioning where daily life is not disrupted.Will be able to return to work and remain on her job 36-40 hrs a week.  Short term goal: Verbalize any unresolved grief issues that may be contributing to  depression.  Strategy: Explore the role of unresolved grief issues as they relate to patient's current depression.   **To update treatment goal plan next visit in order to focus on current need areas. Ran out of time today.  Patient submitted below some of her thoughts to be included in the goal plan:  Very codependant with my friends, I need them to need me, etc Setting boundaries Needs to be able to manage my anixety when stressor arise Not use food and alcohol to self-medicate Better coping skills Finding more ways to connect   Progress: Patient in today reporting depression and anxiety. Had a fall recently and hit her eye, sustaining a cut and bad bruising. Issues with neighbors/friend--"unhelpful". Wanting to work on setting limits with others in session today.  "Other people lean on me so much and there's no give-and-take."  Feeling more alone and is always the "caretaker." As we discussed more of her relationships with neighbors and other, it was obvious that she tend to "take care of everybody else and their needs" and patient ends up feeling alone as others don't seem to care about her and her needs, leading to more isolation..  Together, looked at how her eye injury recently "scratched the scab off my grief of mother's death".  Processed her feelings an sadness, in addition to some behavioral changes that may be helpful to her in relationships, setting limits especially.  States she is continuing to work on "emptying out" her depressive and negative  thoughts.  Encouraged patient in positive self-care including being more out reaching to other people that can be part of her support system, healthier nutrition and exercise, staying in the present, focusing on what she can control versus cannot, staying in touch with relatives who are healthy for her especially a cousin in [place], use positive self talk, and consistently look for more positives and negatives each day.  We ran out of time  today, but did agree with patient on updating her treatment plan goals next session so as to focus on current need areas.  Goal review and progress/challenges noted with patient.   Next appt within 2 weeks.   Mathis Fare, LCSW

## 2020-03-31 ENCOUNTER — Ambulatory Visit: Payer: BC Managed Care – PPO | Admitting: Adult Health

## 2020-04-17 ENCOUNTER — Ambulatory Visit: Payer: BC Managed Care – PPO | Admitting: Psychiatry

## 2020-04-17 ENCOUNTER — Ambulatory Visit: Payer: BC Managed Care – PPO | Admitting: Adult Health

## 2020-04-28 ENCOUNTER — Other Ambulatory Visit: Payer: Self-pay

## 2020-04-28 ENCOUNTER — Ambulatory Visit (INDEPENDENT_AMBULATORY_CARE_PROVIDER_SITE_OTHER): Payer: BC Managed Care – PPO | Admitting: Psychiatry

## 2020-04-28 DIAGNOSIS — F331 Major depressive disorder, recurrent, moderate: Secondary | ICD-10-CM

## 2020-04-28 NOTE — Progress Notes (Signed)
[facility] Counselor/Therapist Progress Note  Patient ID: Deborah Crawford, MRN: [id number],    Date: 04/28/2020  Time Spent: 60 minutes   5:00pm to 6:00pm  Treatment Type: Individual Therapy  Reported Symptoms: anxiety, some depression at times but never any SI and doing better with that.  Mental Status Exam:  Appearance:   Casual     Behavior:  Appropriate and Sharing  Motor:  Normal  Speech/Language:   Clear and Coherent  Affect:  anxious  Mood:  anxious  Thought process:  goal directed  Thought content:    WNL  Sensory/Perceptual disturbances:    WNL  Orientation:  oriented to person, place, time/date, situation, day of week, month of year and year  Attention:  Good  Concentration:  Good  Memory:  WNL  Fund of knowledge:   Good  Insight:    Good and Fair  Judgment:   Good  Impulse Control:  Good   Risk Assessment: Danger to Self:  No Self-injurious Behavior: No Danger to Others: No Duty to Warn:no Physical Aggression / Violence:No  Access to Firearms a concern: No  Gang Involvement:No   Subjective: Patient today reporting anxiety. "depression at times but not currently and haven't for a while." Never any SI.  Interventions: Solution-Oriented/Positive Psychology and Ego-Supportive  Diagnosis:   ICD-10-CM   1. Major depressive disorder, recurrent episode, moderate (HCC)  F33.1     Plan of Care: Patient not signing tx plan on computer screen due to COVID.  Treatment Goals: Goals remain on tx plan as patient works on strategies to reach her goals.  Long term goal: Appropriately grieve the loss of her mother to normalize mood and reach a level of adaptive functioning where daily life is not disrupted.Will be able to return to work and remain on her job 36-40 hrs a week.  Short term goal: Verbalize any unresolved grief issues that may be contributing to depression.  Strategy: Explore the role of unresolved grief issues as they relate to  patient's current depression.  Progress: Patient in today reporting anxiety "but doing better". Not depressed and no SI. No hopelessness. Did make decision to quit her job and is applying for another one next week in her hometown of [place], [place].  Dad has had some serious health issues and is not going to be able to stay by himself. Some improvement in relationship with Dad. Issue with work recently re: credentialing.  Has also more recently had more" isolation and emotional loneliness", difficulty making new friends here.  Is hoping to get home "back home in [place] and have a fresh start." Processed her anxious thoughts and feelings" and feeling ready to make a change that she feels will be good for her.  Per note left and progress note of last session about updating her treatment goal plan, we agreed that that does not need to happen at this point.  She has made significant progress in some ways and at this point does not know if she is going be able to return for appointment due to the fact she may be relocating for a new job.  Has several appointments coming up about possible relocation and a different job, and she will be back in touch as she learns more.  She knows that she can call if needed.  She has made  progress and is to continue focusing on better self-care, boundary setting, effectively managing her anxiety, not using food nor alcohol to self medicate, and following  through on her appointments in reference to job interviews and potential moving back home to [facility].  Patient will be in touch when she knows more about her plans moving forward.    Goal review and progress/challenges noted with patient.  Will call for next appt.   Mathis Fare, LCSW

## 2020-04-30 ENCOUNTER — Ambulatory Visit: Payer: BC Managed Care – PPO | Admitting: Psychiatry

## 2020-07-08 ENCOUNTER — Telehealth (INDEPENDENT_AMBULATORY_CARE_PROVIDER_SITE_OTHER): Payer: BLUE CROSS/BLUE SHIELD | Admitting: Adult Health

## 2020-07-08 ENCOUNTER — Encounter: Payer: Self-pay | Admitting: Adult Health

## 2020-07-08 DIAGNOSIS — F4310 Post-traumatic stress disorder, unspecified: Secondary | ICD-10-CM

## 2020-07-08 DIAGNOSIS — F331 Major depressive disorder, recurrent, moderate: Secondary | ICD-10-CM | POA: Diagnosis not present

## 2020-07-08 DIAGNOSIS — G4700 Insomnia, unspecified: Secondary | ICD-10-CM

## 2020-07-08 DIAGNOSIS — F509 Eating disorder, unspecified: Secondary | ICD-10-CM | POA: Diagnosis not present

## 2020-07-08 DIAGNOSIS — F411 Generalized anxiety disorder: Secondary | ICD-10-CM

## 2020-07-08 DIAGNOSIS — F332 Major depressive disorder, recurrent severe without psychotic features: Secondary | ICD-10-CM

## 2020-07-08 MED ORDER — ALPRAZOLAM 0.25 MG PO TABS: 0.2500 mg | ORAL_TABLET | Freq: Three times a day (TID) | ORAL | 2 refills | Status: DC | PRN

## 2020-07-08 MED ORDER — AMPHETAMINE-DEXTROAMPHET ER 10 MG PO CP24: 10.0000 mg | ORAL_CAPSULE | Freq: Every day | ORAL | 0 refills | Status: DC

## 2020-07-08 NOTE — Progress Notes (Signed)
Deborah Crawford [id number] 24-Dec-1965 55 y.o.  Virtual Visit via Video Note  I connected with pt @ on 07/08/20 at  5:00 PM EST by a video enabled telemedicine application and verified that I am speaking with the correct person using two identifiers.   I discussed the limitations of evaluation and management by telemedicine and the availability of in person appointments. The patient expressed understanding and agreed to proceed.  I discussed the assessment and treatment plan with the patient. The patient was provided an opportunity to ask questions and all were answered. The patient agreed with the plan and demonstrated an understanding of the instructions.   The patient was advised to call back or seek an in-person evaluation if the symptoms worsen or if the condition fails to improve as anticipated.  I provided 30 minutes of non-face-to-face time during this encounter.  The patient was located at home.  The provider was located at [facility].   Dorothyann Gibbs, NP   Subjective:   Patient ID:  Deborah Crawford is a 55 y.o. (DOB 11-06-1965) female.  Chief Complaint: No chief complaint on file.   HPI Deborah Crawford presents for follow-up of MDD, GAD, PTSD, insomnia, and Binge eating disorder.   Describes mood today as "ok". Pleasant. Tearful at times. Mood symptoms - reports depression, anxiety, and irritable. Short tempered. Having panic attacks. Stating "I feel mentally tired". Father recently passed away - went to [place] to help him. Plans to stay near [facility] -" I want to be around my family". Has joined a binge eating group. Stable interest and motivation. Taking medications as prescribed.  Energy levels stable. Active, does not have a regular exercise routine. Walking dog.  Enjoys some usual interests and activities. Single. Lives alone with dog poodle "koha". Mostly staying home. Spending time with family. Appetite increased. Weight gain 10 pounds.  Has started binge eating again. Sleeping difficulties. Averages 4 hours of broken sleep. Having nightmares. Focus and concentration stable. Completing tasks. Managing aspects of household. 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 Seeing [name] for therapy.   Previous medications: Prozac-tired, Zoloft, Lexapro, Paxil, Effexor-SI, Wellbutrin-SI, Buspar-nightmares, Abilify - side effects.    Review of Systems:  Review of Systems  Musculoskeletal: Negative for gait problem.  Neurological: Negative for tremors.  Psychiatric/Behavioral:       Please refer to HPI    Medications: I have reviewed the patient's current medications.  Current Outpatient Medications  Medication Sig Dispense Refill  . amphetamine-dextroamphetamine (ADDERALL XR) 10 MG 24 hr capsule Take 1 capsule (10 mg total) by mouth daily. 30 capsule 0  . ALPRAZolam (XANAX) 0.25 MG tablet Take 1 tablet (0.25 mg total) by mouth 3 (three) times daily as needed for anxiety. 90 tablet 2  . B Complex Vitamins (VITAMIN B COMPLEX) TABS Take by mouth.    . Biotin 1 MG CAPS Take by mouth.    . Cholecalciferol 50 MCG (2000 UT) CAPS Take by mouth.    . doxycycline (PERIOSTAT) 20 MG tablet     . DULoxetine (CYMBALTA) 60 MG capsule Take 1 capsule (60 mg total) by mouth 2 (two) times daily. 60 capsule 5  . hydrOXYzine (ATARAX/VISTARIL) 25 MG tablet Take one tablet up to 4 times daily prn sleep/anxiety. 360 tablet 1  . levothyroxine (SYNTHROID, LEVOTHROID) 150 MCG tablet Take 1 tablet by mouth daily.    [name] liothyronine (CYTOMEL) 5 MCG tablet Take 10 mcg by mouth daily.    [name]  Multiple Vitamin (MULTI-VITAMIN) tablet Take by mouth.    . polyethylene glycol-electrolytes (NULYTELY/GOLYTELY) 420 g solution     . spironolactone (ALDACTONE) 50 MG tablet TK 1 T PO QD UTD    . SYNTHROID 100 MCG tablet Take 100 mcg by mouth every morning.    [name] SYNTHROID 112 MCG tablet Take 112 mcg by mouth daily.     No  current facility-administered medications for this visit.    Medication Side Effects: None  Allergies:  Allergies  Allergen Reactions  . Oxycodone-Acetaminophen Other (See Comments)    Severe migraine  . Mangifera Indica Other (See Comments)    Lips tingle  . Latex Other (See Comments), Itching and Rash    Past Medical History:  Diagnosis Date  . Anxiety   . Hand arthritis   . hypothyroidism   . Primary insomnia   . Retinal detachment     Family History  Problem Relation Age of Onset  . Esophageal cancer Mother   . Rashes / Skin problems Mother   . Rheum arthritis Mother   . Hypertension Father   . Hypertension Maternal Grandmother   . Hypertension Maternal Grandfather     Social History   Socioeconomic History  . Marital status: Single    Spouse name: Not on file  . Number of children: 0  . Years of education: Not on file  . Highest education level: Not on file  Occupational History  . Occupation: mammogram tech    Employer: [employer]  Tobacco Use  . Smoking status: Never Smoker  . Smokeless tobacco: Never Used  Vaping Use  . Vaping Use: Never used  Substance and Sexual Activity  . Alcohol use: Yes    Comment: Socially on the weekends  . Drug use: Never  . Sexual activity: Not on file  Other Topics Concern  . Not on file  Social History Narrative   Lives alone in a 2 story home.  No children.    Highest level of education:  Bachelors x 2   Social Determinants of Corporate investment banker Strain: Not on file  Food Insecurity: Not on file  Transportation Needs: Not on file  Physical Activity: Not on file  Stress: Not on file  Social Connections: Not on file  Intimate Partner Violence: Not on file    Past Medical History, Surgical history, Social history, and Family history were reviewed and updated as appropriate.   Please see review of systems for further details on the patient's review from today.   Objective:   Physical Exam:   There were no vitals taken for this visit.  Physical Exam Constitutional:      General: She is not in acute distress. Musculoskeletal:        General: No deformity.  Neurological:     Mental Status: She is alert and oriented to person, place, and time.     Coordination: Coordination normal.  Psychiatric:        Attention and Perception: Attention and perception normal. She does not perceive auditory or visual hallucinations.        Mood and Affect: Mood normal. Mood is not anxious or depressed. Affect is not labile, blunt, angry or inappropriate.        Speech: Speech normal.        Behavior: Behavior normal.        Thought Content: Thought content normal. Thought content is not paranoid or delusional. Thought content does not include homicidal or suicidal ideation.  Thought content does not include homicidal or suicidal plan.        Cognition and Memory: Cognition and memory normal.        Judgment: Judgment normal.     Comments: Insight intact     Lab Review:  No results found for: NA, K, CL, CO2, GLUCOSE, BUN, CREATININE, CALCIUM, PROT, ALBUMIN, AST, ALT, ALKPHOS, BILITOT, GFRNONAA, GFRAA  No results found for: WBC, RBC, HGB, HCT, PLT, MCV, MCH, MCHC, RDW, LYMPHSABS, MONOABS, EOSABS, BASOSABS  No results found for: POCLITH, LITHIUM   No results found for: PHENYTOIN, PHENOBARB, VALPROATE, CBMZ   .res Assessment: Plan:     Plan:  1. Cymbalta 60mg  BID 2. Hydroxyzine 25mg  tablet - 4 tablets daily prn 3. Increase Xanax 0.25mg  daily to TID prn anxiety attacks 4. Add Adderall XR 10mg  daily - binge eating  Will follow up with Endocrinology  Continue therapy -  RTC 4 weeks  Patient advised to contact office with any questions, adverse effects, or acute worsening in signs and symptoms.    Diagnoses and all orders for this visit:  Severe episode of recurrent major depressive disorder, without psychotic features (HCC)  Major depressive disorder, recurrent  episode, moderate (HCC)  Eating disorder, unspecified type -     amphetamine-dextroamphetamine (ADDERALL XR) 10 MG 24 hr capsule; Take 1 capsule (10 mg total) by mouth daily.  Generalized anxiety disorder -     ALPRAZolam (XANAX) 0.25 MG tablet; Take 1 tablet (0.25 mg total) by mouth 3 (three) times daily as needed for anxiety.  Insomnia, unspecified type -     ALPRAZolam (XANAX) 0.25 MG tablet; Take 1 tablet (0.25 mg total) by mouth 3 (three) times daily as needed for anxiety.  PTSD (post-traumatic stress disorder)     Please see After Visit Summary for patient specific instructions.  Future Appointments  Date Time Provider Department Center  07/18/2020  8:00 AM , LCSW [facility] None    No orders of the defined types were placed in this encounter.     -------------------------------

## 2020-07-11 ENCOUNTER — Ambulatory Visit (INDEPENDENT_AMBULATORY_CARE_PROVIDER_SITE_OTHER): Payer: BLUE CROSS/BLUE SHIELD | Admitting: Psychiatry

## 2020-07-11 DIAGNOSIS — F332 Major depressive disorder, recurrent severe without psychotic features: Secondary | ICD-10-CM

## 2020-07-11 NOTE — Progress Notes (Signed)
[facility] Counselor/Therapist Progress Note  Patient ID: Deborah Crawford, MRN: [id number],    Date: 07/11/2020  Time Spent: 60 minutes   10:00am to 11:00am  Virtual Visit Note via MyChart Video: Connected with patient by a video enabled telemedicine/telehealth application or telephone, with their informed consent, and verified patient privacy and that I am speaking with the correct person using two identifiers. I discussed the limitations, risks, security and privacy concerns of performing psychotherapy and management service by telephone and the availability of in person appointments. I also discussed with the patient that there may be a patient responsible charge related to this service. The patient expressed understanding and agreed to proceed. I discussed the treatment planning with the patient. The patient was provided an opportunity to ask questions and all were answered. The patient agreed with the plan and demonstrated an understanding of the instructions. The patient was advised to call  our office if  symptoms worsen or feel they are in a crisis state and need immediate contact.   Therapist Location: [facility] Patient Location: home   Treatment Type: Individual Therapy  Reported Symptoms: anxiety, sadness, desperate, disappointed, situational anger  Mental Status Exam:  Appearance:   Casual     Behavior:  Appropriate, Sharing and Motivated  Motor:  Normal  Speech/Language:   Clear and Coherent  Affect:  anxious, sad, depressed, some anger,disappointed  Mood:  anxious, depressed and sad  Thought process:  goal directed  Thought content:    some obsessiveness  Sensory/Perceptual disturbances:    WNL  Orientation:  oriented to person, place, time/date, situation, day of week, month of year and year  Attention:  Poor  Concentration:  Poor  Memory:  WNL  Fund of knowledge:   Good  Insight:    Fair  Judgment:   Good  Impulse Control:  Fair and Poor with  food choices   Risk Assessment: Danger to Self:  No Self-injurious Behavior: No Danger to Others: No Duty to Warn:no Physical Aggression / Violence:No  Access to Firearms a concern: No  Gang Involvement:No   Subjective: Patient today reports anxiety, grief over father's recent death, using food and sometimes alcohol for comfort, gained 35lbs in approx 6-7 months. Dad died and she has moved to his house.   Interventions: Cognitive Behavioral Therapy, Ego-Supportive and Grief Therapy  Diagnosis:   ICD-10-CM   1. Severe episode of recurrent major depressive disorder, without psychotic features (HCC)  F33.2      Plan of Care: Patient not signing tx plan on computer screen due to COVID.  Treatment Goals: Goals remain on tx plan as patient works on strategies to reach her goals.  Long term goal: Appropriately grieve the loss of her mother to normalize mood and reach a level of adaptive functioning where daily life is not disrupted.Will be able to return to work and remain on her job 36-40 hrs a week.  Short term goal: Verbalize any unresolved grief issues that may be contributing to depression.  Strategy: Explore the role of unresolved grief issues as they relate to patient's current depression.  Progress: Patient today reporting anxiety, grief, depression, over-using food and alcohol for comfort, sadness, disappointed, desperate, and is frustrated with herself. Oldest sister is not very supportive nor helpful but middle sister "Selena Batten" is more supportive and helpful.  Denies any SI. Dad died recently and patient is planning to remain in [place], [place] where other 2 sisters live.  Processed today her feelings of sadness,  frustration and anger with herself, anxiety, depression, desperation, and disappointment. Did report feeling less desperate, some decrease in anxiety, and feeling some more grounded. "Wishes this could just be better fast,like now." Discussed how she can cut  out her excessive drinking and how to say "NO" to things that are self-defeating for herself.  Promises to work on the issues today surrounding her anxiety, depression, frustration, sadness, and grief.  Reports feeling less desperate.  Still denies any SI.  Commits to calling at least 1 friend down in the area where she is staying now in [facility].  Also promises to follow through and checking out resources with her hospice there, including the grief share program.  Wants to reschedule again in the future and will call once she has checked out the resources there that I have encouraged her to pursue.  Encouraged her to follow-up on boundary setting with others as needed, practicing positive affirming self talk, stopping her over drinking and using food for comfort, to practice treating herself like she would a friend, to let go of the self negating and self blaming, and to reach out to others that are living near her that she knows would be supportive.   Goal review and progress/challenges noted with patient.  Next appointment within 2 to 3 weeks.   Mathis Fare, LCSW

## 2020-07-12 IMAGING — US ULTRASOUND LEFT BREAST LIMITED
1 series · 1 of 1 positions shown · non-contrast
Comparison: Previous exam(s).

CLINICAL DATA: Follow-up for probably benign right breast mass felt
to be related to an area of fat necrosis. History of multiple
bilateral breast surgeries with most recent implant revision 2637.
The patient developed a postoperative seroma in the outer left
breast after the most recent revision.

EXAM:
2D DIGITAL DIAGNOSTIC BILATERAL MAMMOGRAM WITH IMPLANTS, CAD AND
ADJUNCT TOMO
BILATERAL BREAST ULTRASOUND
The patient has bilateral retroglandular silicone implants. Standard
and implant displaced views were performed.

[Series 1: ultrasound left breast limited · 0.08mm/px · 1 of 1 slices shown]
[im 1/1]
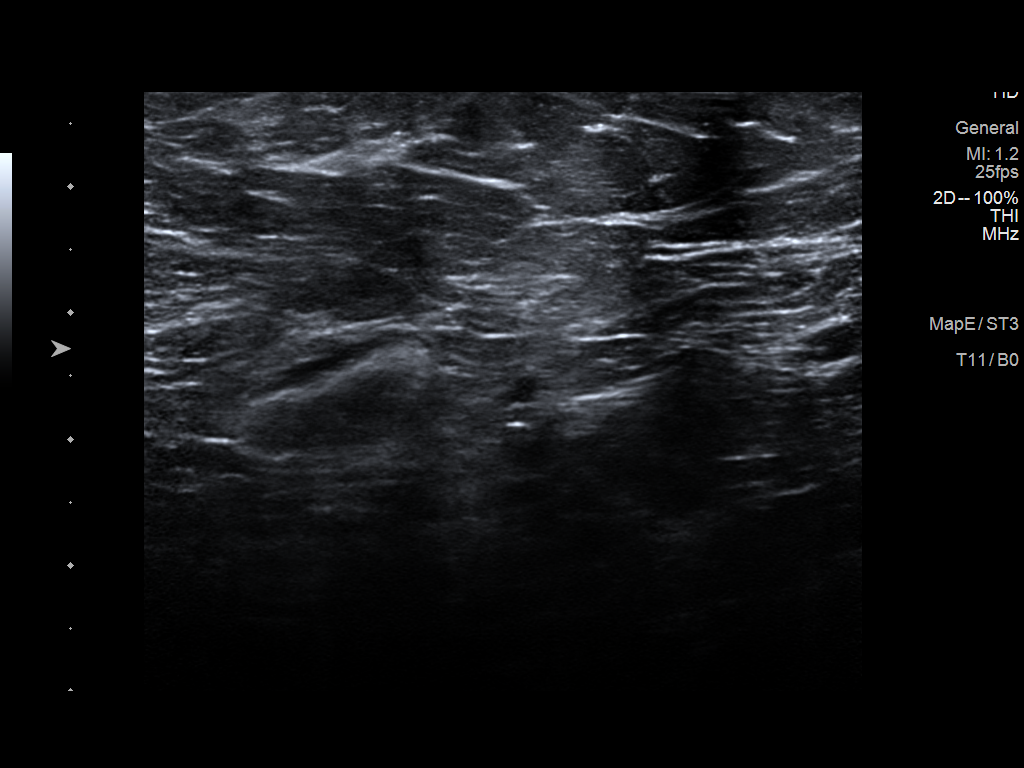

[1 of 1 positions shown; findings below may reference images not displayed]

ACR Breast Density Category c: The breast tissue is heterogeneously
dense, which may obscure small masses.
FINDINGS: No suspicious masses are seen in the right breast. There is an
asymmetry in the left breast with several associated oil cysts, felt
to be related to postsurgical scarring. This is overall similar in
appearance on the additional views when compared to the prior exam.

Mammographic images were processed with CAD.

Targeted ultrasound of the right breast was performed demonstrating
a round circumscribed near anechoic mass at 2 o'clock 1 cm from the
nipple measuring 0.3 x 0.3 x 0.3 cm, stable in size and appearance
when compared to the prior exam. This demonstrates imaging features
most consistent with an oil cyst.

Targeted ultrasound of the left breast was performed demonstrating
postsurgical scarring in the upper and outer left breast as well as
scattered oil cysts, with no suspicious masses identified.
IMPRESSION: Stable oil cyst in the right breast and postsurgical changes in the
left breast. There is no mammographic evidence of malignancy.

RECOMMENDATION:
Bilateral diagnostic mammography in 12 months to demonstrate 2 years
of stability.

I have discussed the findings and recommendations with the patient.
Results were also provided in writing at the conclusion of the
visit. If applicable, a reminder letter will be sent to the patient
regarding the next appointment.

BI-RADS CATEGORY  3: Probably benign.

## 2020-07-18 ENCOUNTER — Ambulatory Visit: Payer: BLUE CROSS/BLUE SHIELD | Admitting: Psychiatry

## 2020-08-29 ENCOUNTER — Telehealth (INDEPENDENT_AMBULATORY_CARE_PROVIDER_SITE_OTHER): Payer: BLUE CROSS/BLUE SHIELD | Admitting: Adult Health

## 2020-08-29 ENCOUNTER — Encounter: Payer: Self-pay | Admitting: Adult Health

## 2020-08-29 DIAGNOSIS — F4310 Post-traumatic stress disorder, unspecified: Secondary | ICD-10-CM

## 2020-08-29 DIAGNOSIS — F331 Major depressive disorder, recurrent, moderate: Secondary | ICD-10-CM | POA: Diagnosis not present

## 2020-08-29 DIAGNOSIS — G4700 Insomnia, unspecified: Secondary | ICD-10-CM

## 2020-08-29 DIAGNOSIS — F509 Eating disorder, unspecified: Secondary | ICD-10-CM

## 2020-08-29 DIAGNOSIS — F411 Generalized anxiety disorder: Secondary | ICD-10-CM

## 2020-08-29 NOTE — Progress Notes (Signed)
Deborah Crawford [id number] May 13, 1965 55 y.o.  Virtual Visit via Video Note  I connected with pt @ on 08/29/20 at  3:20 PM EDT by a video enabled telemedicine application and verified that I am speaking with the correct person using two identifiers.   I discussed the limitations of evaluation and management by telemedicine and the availability of in person appointments. The patient expressed understanding and agreed to proceed.  I discussed the assessment and treatment plan with the patient. The patient was provided an opportunity to ask questions and all were answered. The patient agreed with the plan and demonstrated an understanding of the instructions.   The patient was advised to call back or seek an in-person evaluation if the symptoms worsen or if the condition fails to improve as anticipated.  I provided 30 minutes of non-face-to-face time during this encounter.  The patient was located at home.  The provider was located at [facility].   Deborah Gibbs, NP   Subjective:   Patient ID:  Deborah Crawford is a 55 y.o. (DOB 10-Mar-1966) female.  Chief Complaint: No chief complaint on file.   HPI Deborah Crawford presents for follow-up of MDD, GAD, PTSD, insomnia, and Binge eating disorder.   Describes mood today as "ok". Pleasant. Tearful at times. Mood symptoms - reports depression, anxiety, and irritability. Having panic attacks. Stating "I feel destined to be melancholy for the rest of my life". Is tired of loss and sadness. Feels like family is toxic. Feels lost and lonely. Stating "I feel helpless". Does attend therapy. Recently went to a retreat at the beach. Living in [place] - wanting to be around family. Father's house is now under contract. Decreased alcohol use. Decreased interest and motivation. Taking medications as prescribed.  Energy levels stable. Active, does not have a regular exercise routine. Walking dog.  Enjoys some usual interests and  activities. Single. Lives alone with dog "koha". Mostly staying home. Spending time with family. Appetite increased. Weight gain. Binge eating not as bad.  Sleeping difficulties. Averages - waking up during the night. Focus and concentration stable. Completing tasks. Managing aspects of household. 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 Sees [name] for therapy.  Previous medications: Prozac-tired, Zoloft, Lexapro, Paxil, Effexor-SI, Wellbutrin-SI, Buspar-nightmares, Abilify - side effects.  Review of Systems:  Review of Systems  Musculoskeletal: Negative for gait problem.  Neurological: Negative for tremors.  Psychiatric/Behavioral:       Please refer to HPI    Medications: I have reviewed the patient's current medications.  Current Outpatient Medications  Medication Sig Dispense Refill  . ALPRAZolam (XANAX) 0.25 MG tablet Take 1 tablet (0.25 mg total) by mouth 3 (three) times daily as needed for anxiety. 90 tablet 2  . amphetamine-dextroamphetamine (ADDERALL XR) 10 MG 24 hr capsule Take 1 capsule (10 mg total) by mouth daily. 30 capsule 0  . B Complex Vitamins (VITAMIN B COMPLEX) TABS Take by mouth.    . Biotin 1 MG CAPS Take by mouth.    . Cholecalciferol 50 MCG (2000 UT) CAPS Take by mouth.    . doxycycline (PERIOSTAT) 20 MG tablet     . DULoxetine (CYMBALTA) 60 MG capsule Take 1 capsule (60 mg total) by mouth 2 (two) times daily. 60 capsule 5  . hydrOXYzine (ATARAX/VISTARIL) 25 MG tablet Take one tablet up to 4 times daily prn sleep/anxiety. 360 tablet 1  . levothyroxine (SYNTHROID, LEVOTHROID) 150 MCG tablet Take 1 tablet  by mouth daily.    [name] liothyronine (CYTOMEL) 5 MCG tablet Take 10 mcg by mouth daily.    . Multiple Vitamin (MULTI-VITAMIN) tablet Take by mouth.    . polyethylene glycol-electrolytes (NULYTELY/GOLYTELY) 420 g solution     . spironolactone (ALDACTONE) 50 MG tablet TK 1 T PO QD UTD    . SYNTHROID 100 MCG tablet Take  100 mcg by mouth every morning.    [name] SYNTHROID 112 MCG tablet Take 112 mcg by mouth daily.     No current facility-administered medications for this visit.    Medication Side Effects: None  Allergies:  Allergies  Allergen Reactions  . Oxycodone-Acetaminophen Other (See Comments)    Severe migraine  . Mangifera Indica Other (See Comments)    Lips tingle  . Latex Other (See Comments), Itching and Rash    Past Medical History:  Diagnosis Date  . Anxiety   . Hand arthritis   . hypothyroidism   . Primary insomnia   . Retinal detachment     Family History  Problem Relation Age of Onset  . Esophageal cancer Mother   . Rashes / Skin problems Mother   . Rheum arthritis Mother   . Hypertension Father   . Hypertension Maternal Grandmother   . Hypertension Maternal Grandfather     Social History   Socioeconomic History  . Marital status: Single    Spouse name: Not on file  . Number of children: 0  . Years of education: Not on file  . Highest education level: Not on file  Occupational History  . Occupation: mammogram tech    Employer: [employer]  Tobacco Use  . Smoking status: Never Smoker  . Smokeless tobacco: Never Used  Vaping Use  . Vaping Use: Never used  Substance and Sexual Activity  . Alcohol use: Yes    Comment: Socially on the weekends  . Drug use: Never  . Sexual activity: Not on file  Other Topics Concern  . Not on file  Social History Narrative   Lives alone in a 2 story home.  No children.    Highest level of education:  Bachelors x 2   Social Determinants of Corporate investment banker Strain: Not on file  Food Insecurity: Not on file  Transportation Needs: Not on file  Physical Activity: Not on file  Stress: Not on file  Social Connections: Not on file  Intimate Partner Violence: Not on file    Past Medical History, Surgical history, Social history, and Family history were reviewed and updated as appropriate.   Please see review  of systems for further details on the patient's review from today.   Objective:   Physical Exam:  There were no vitals taken for this visit.  Physical Exam Constitutional:      General: She is not in acute distress. Musculoskeletal:        General: No deformity.  Neurological:     Mental Status: She is alert and oriented to person, place, and time.     Coordination: Coordination normal.  Psychiatric:        Attention and Perception: Attention and perception normal. She does not perceive auditory or visual hallucinations.        Mood and Affect: Mood normal. Mood is not anxious or depressed. Affect is not labile, blunt, angry or inappropriate.        Speech: Speech normal.        Behavior: Behavior normal.  Thought Content: Thought content normal. Thought content is not paranoid or delusional. Thought content does not include homicidal or suicidal ideation. Thought content does not include homicidal or suicidal plan.        Cognition and Memory: Cognition and memory normal.        Judgment: Judgment normal.     Comments: Insight intact     Lab Review:  No results found for: NA, K, CL, CO2, GLUCOSE, BUN, CREATININE, CALCIUM, PROT, ALBUMIN, AST, ALT, ALKPHOS, BILITOT, GFRNONAA, GFRAA  No results found for: WBC, RBC, HGB, HCT, PLT, MCV, MCH, MCHC, RDW, LYMPHSABS, MONOABS, EOSABS, BASOSABS  No results found for: POCLITH, LITHIUM   No results found for: PHENYTOIN, PHENOBARB, VALPROATE, CBMZ   .res Assessment: Plan:     Plan:  1. Cymbalta 60mg  BID 2. Hydroxyzine 25mg  tablet - 4 tablets daily prn 3. Increase Xanax 0.25mg  daily to TID prn anxiety attacks 4. D/C Adderall XR 10mg  daily - binge eating   Time spent with patient was 30 minutes. Greater than 50% of face to face time with patient was spent on counseling and coordination of care.  Continue therapy -  RTC 4 weeks  Patient advised to contact office with any questions, adverse effects, or acute  worsening in signs and symptoms.  Discussed potential benefits, risk, and side effects of benzodiazepines to include potential risk of tolerance and dependence, as well as possible drowsiness.  Advised patient not to drive if experiencing drowsiness and to take lowest possible effective dose to minimize risk of dependence and tolerance.    There are no diagnoses linked to this encounter.   Please see After Visit Summary for patient specific instructions.  No future appointments.  No orders of the defined types were placed in this encounter.     -------------------------------

## 2020-09-11 ENCOUNTER — Telehealth: Payer: Self-pay | Admitting: Adult Health

## 2020-09-11 ENCOUNTER — Other Ambulatory Visit: Payer: Self-pay | Admitting: Adult Health

## 2020-09-11 NOTE — Telephone Encounter (Signed)
Please review

## 2020-09-11 NOTE — Telephone Encounter (Signed)
Please call to confirm. I do not have her on Ambien.

## 2020-09-11 NOTE — Telephone Encounter (Signed)
Deborah Crawford called stating her Ambien is not helping her sleep. She is wondering what she needs to do? Can she increase the Ambien or perhaps be put on something else. Her pharmacy is:  [facility] [address] - [place], [place] - [address] Phone:  [phone]  Fax:  [phone]

## 2020-09-12 ENCOUNTER — Telehealth: Payer: Self-pay

## 2020-09-12 ENCOUNTER — Other Ambulatory Visit: Payer: Self-pay | Admitting: Adult Health

## 2020-09-12 DIAGNOSIS — G4700 Insomnia, unspecified: Secondary | ICD-10-CM

## 2020-09-12 MED ORDER — ZOLPIDEM TARTRATE 5 MG PO TABS: 5.0000 mg | ORAL_TABLET | Freq: Every evening | ORAL | 0 refills | Status: DC | PRN

## 2020-09-12 NOTE — Telephone Encounter (Signed)
We can send in a 5mg  of Ambien.

## 2020-09-12 NOTE — Telephone Encounter (Signed)
Script sent  

## 2020-09-12 NOTE — Telephone Encounter (Signed)
I let her know but I can't find the ambien 5 mg only 12.5 can you please send to the [facility] on file

## 2020-09-12 NOTE — Telephone Encounter (Signed)
Pt stated Xanax is not helping her sleep.She only gets a couple of hours and she would like to get a full night of rest.She mentioned adding ambien for sleep.

## 2020-09-18 ENCOUNTER — Other Ambulatory Visit: Payer: Self-pay

## 2020-09-18 DIAGNOSIS — G4700 Insomnia, unspecified: Secondary | ICD-10-CM

## 2020-09-18 MED ORDER — ZOLPIDEM TARTRATE 5 MG PO TABS: 5.0000 mg | ORAL_TABLET | Freq: Every evening | ORAL | 0 refills | Status: AC | PRN

## 2020-09-18 NOTE — Telephone Encounter (Signed)
Pt said that her last rx for Ambien was called in at wrong pharmacy. Please send to [facility] at [facility], in [place].

## 2020-09-18 NOTE — Telephone Encounter (Signed)
Rx sent 

## 2020-09-18 NOTE — Telephone Encounter (Signed)
Cancelled and pended at correct pharmacy

## 2020-10-20 ENCOUNTER — Other Ambulatory Visit: Payer: Self-pay | Admitting: Adult Health

## 2020-10-20 DIAGNOSIS — F411 Generalized anxiety disorder: Secondary | ICD-10-CM

## 2020-10-20 DIAGNOSIS — F4310 Post-traumatic stress disorder, unspecified: Secondary | ICD-10-CM

## 2020-10-20 DIAGNOSIS — F509 Eating disorder, unspecified: Secondary | ICD-10-CM

## 2020-10-20 DIAGNOSIS — G4700 Insomnia, unspecified: Secondary | ICD-10-CM

## 2020-10-20 DIAGNOSIS — F331 Major depressive disorder, recurrent, moderate: Secondary | ICD-10-CM

## 2020-10-22 NOTE — Telephone Encounter (Signed)
Pt called in for refill on Cymbalta 160mg . She has changed her pharmacy to [facility]. She also mentioned that she has moved and would like to know what she do about appts moving forward. If she should continue telehealth visits or find a new provider. Pls RTC [phone] to discuss. Pharmacy [facility] [address] [place], [place].

## 2020-10-23 ENCOUNTER — Other Ambulatory Visit: Payer: Self-pay

## 2020-10-23 DIAGNOSIS — F411 Generalized anxiety disorder: Secondary | ICD-10-CM

## 2020-10-23 DIAGNOSIS — G4700 Insomnia, unspecified: Secondary | ICD-10-CM

## 2020-10-23 DIAGNOSIS — F509 Eating disorder, unspecified: Secondary | ICD-10-CM

## 2020-10-23 DIAGNOSIS — F4310 Post-traumatic stress disorder, unspecified: Secondary | ICD-10-CM

## 2020-10-23 DIAGNOSIS — F331 Major depressive disorder, recurrent, moderate: Secondary | ICD-10-CM

## 2020-10-23 MED ORDER — DULOXETINE HCL 60 MG PO CPEP: 60.0000 mg | ORAL_CAPSULE | Freq: Two times a day (BID) | ORAL | 1 refills | Status: DC

## 2020-10-23 NOTE — Telephone Encounter (Signed)
Have staff call - if insurance covers she can do tele-health.

## 2020-10-23 NOTE — Telephone Encounter (Signed)
Im going to refuse this so that it can go to the right pharmacy.Please see message about future appts patient has moved

## 2020-10-23 NOTE — Telephone Encounter (Signed)
Please find out if the patients insurance will cover telehealth visits and let her know

## 2020-12-13 ENCOUNTER — Other Ambulatory Visit: Payer: Self-pay | Admitting: Adult Health

## 2020-12-13 DIAGNOSIS — F4310 Post-traumatic stress disorder, unspecified: Secondary | ICD-10-CM

## 2020-12-13 DIAGNOSIS — F411 Generalized anxiety disorder: Secondary | ICD-10-CM

## 2020-12-13 DIAGNOSIS — G4700 Insomnia, unspecified: Secondary | ICD-10-CM

## 2020-12-13 DIAGNOSIS — F331 Major depressive disorder, recurrent, moderate: Secondary | ICD-10-CM

## 2020-12-13 DIAGNOSIS — F509 Eating disorder, unspecified: Secondary | ICD-10-CM

## 2020-12-17 NOTE — Telephone Encounter (Signed)
Please schedule appt

## 2020-12-19 NOTE — Telephone Encounter (Signed)
Pt called requesting RF for Cymbalta 60 mg 2/d @ [facility]. Pt will be out Monday. Pt has apt 8/23

## 2020-12-30 ENCOUNTER — Telehealth (INDEPENDENT_AMBULATORY_CARE_PROVIDER_SITE_OTHER): Payer: BLUE CROSS/BLUE SHIELD | Admitting: Adult Health

## 2020-12-30 ENCOUNTER — Encounter: Payer: Self-pay | Admitting: Adult Health

## 2020-12-30 DIAGNOSIS — F411 Generalized anxiety disorder: Secondary | ICD-10-CM

## 2020-12-30 DIAGNOSIS — F4310 Post-traumatic stress disorder, unspecified: Secondary | ICD-10-CM | POA: Diagnosis not present

## 2020-12-30 DIAGNOSIS — F331 Major depressive disorder, recurrent, moderate: Secondary | ICD-10-CM | POA: Diagnosis not present

## 2020-12-30 DIAGNOSIS — F509 Eating disorder, unspecified: Secondary | ICD-10-CM

## 2020-12-30 DIAGNOSIS — G4700 Insomnia, unspecified: Secondary | ICD-10-CM | POA: Diagnosis not present

## 2020-12-30 MED ORDER — DULOXETINE HCL 30 MG PO CPEP: 30.0000 mg | ORAL_CAPSULE | Freq: Every day | ORAL | 1 refills | Status: DC

## 2020-12-30 MED ORDER — DULOXETINE HCL 60 MG PO CPEP: 60.0000 mg | ORAL_CAPSULE | Freq: Two times a day (BID) | ORAL | 1 refills | Status: DC

## 2020-12-30 NOTE — Progress Notes (Signed)
Deborah Crawford [id number] August 30, 1965 55 y.o.  Virtual Visit via Video Note  I connected with pt @ on 12/30/20 at 10:40 AM EDT by a video enabled telemedicine application and verified that I am speaking with the correct person using two identifiers.   I discussed the limitations of evaluation and management by telemedicine and the availability of in person appointments. The patient expressed understanding and agreed to proceed.  I discussed the assessment and treatment plan with the patient. The patient was provided an opportunity to ask questions and all were answered. The patient agreed with the plan and demonstrated an understanding of the instructions.   The patient was advised to call back or seek an in-person evaluation if the symptoms worsen or if the condition fails to improve as anticipated.  I provided 25 minutes of non-face-to-face time during this encounter.  The patient was located at home.  The provider was located at [facility].   Dorothyann Gibbs, NP   Subjective:   Patient ID:  Deborah Crawford is a 55 y.o. (DOB 05-26-1965) female.  Chief Complaint: No chief complaint on file.   HPI Deborah Crawford presents for follow-up of MDD, GAD, PTSD, insomnia, and Binge eating disorder.   Describes mood today as "ok". Pleasant. Tearful at times. Mood symptoms - reports depression - "still down and out", anxiety - "less", and irritability - "short fused". Denies panic attacks. Stating "I feel like I am doing ok" - "melancholy". Decreased stressors - "over the hump". Recently bought a house and moved to [place]. Stating "I'm happy to have a home and be where I am". Started hormones for menopause. Working with PCP to manage medical issues. Improved interest and motivation. Taking medications as prescribed.  Energy levels stable. Active, does not have a regular exercise routine. Walking dog.  Enjoys some usual interests and activities. Single. Lives alone with dog  "Koha". Mostly staying home. Spending time with family. Appetite increased. Weight gain - 50 pounds - 205 pounds currently.  Sleeping difficulties. Averages 7 to 8 hours. Focus and concentration stable. Completing tasks. Managing aspects of household. 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 Seeing therapist.  Previous medications: Prozac - tired, Zoloft, Lexapro, Paxil, Effexor-SI, Wellbutrin - SI, Buspar-nightmares, Abilify - side effects.   Review of Systems:  Review of Systems  Musculoskeletal:  Negative for gait problem.  Neurological:  Negative for tremors.  Psychiatric/Behavioral:         Please refer to HPI   Medications: I have reviewed the patient's current medications.  Current Outpatient Medications  Medication Sig Dispense Refill   DULoxetine (CYMBALTA) 30 MG capsule Take 1 capsule (30 mg total) by mouth daily. 90 capsule 1   ALPRAZolam (XANAX) 0.25 MG tablet Take 1 tablet (0.25 mg total) by mouth 3 (three) times daily as needed for anxiety. 90 tablet 2   amphetamine-dextroamphetamine (ADDERALL XR) 10 MG 24 hr capsule Take 1 capsule (10 mg total) by mouth daily. 30 capsule 0   B Complex Vitamins (VITAMIN B COMPLEX) TABS Take by mouth.     Biotin 1 MG CAPS Take by mouth.     Cholecalciferol 50 MCG (2000 UT) CAPS Take by mouth.     doxycycline (PERIOSTAT) 20 MG tablet      DULoxetine (CYMBALTA) 60 MG capsule Take 1 capsule (60 mg total) by mouth 2 (two) times daily. 180 capsule 1   hydrOXYzine (ATARAX/VISTARIL) 25 MG tablet Take one tablet up to  4 times daily prn sleep/anxiety. 360 tablet 1   levothyroxine (SYNTHROID, LEVOTHROID) 150 MCG tablet Take 1 tablet by mouth daily.     Multiple Vitamin (MULTI-VITAMIN) tablet Take by mouth.     polyethylene glycol-electrolytes (NULYTELY/GOLYTELY) 420 g solution      spironolactone (ALDACTONE) 50 MG tablet TK 1 T PO QD UTD     SYNTHROID 100 MCG tablet Take 100 mcg by mouth every morning.      SYNTHROID 112 MCG tablet Take 112 mcg by mouth daily.     zolpidem (AMBIEN) 5 MG tablet Take 1 tablet (5 mg total) by mouth at bedtime as needed for sleep. 30 tablet 0   No current facility-administered medications for this visit.    Medication Side Effects: None  Allergies:  Allergies  Allergen Reactions   Oxycodone-Acetaminophen Other (See Comments)    Severe migraine   Mangifera Indica Other (See Comments)    Lips tingle   Latex Other (See Comments), Itching and Rash    Past Medical History:  Diagnosis Date   Anxiety    Hand arthritis    hypothyroidism    Primary insomnia    Retinal detachment     Family History  Problem Relation Age of Onset   Esophageal cancer Mother    Rashes / Skin problems Mother    Rheum arthritis Mother    Hypertension Father    Hypertension Maternal Grandmother    Hypertension Maternal Grandfather     Social History   Socioeconomic History   Marital status: Single    Spouse name: Not on file   Number of children: 0   Years of education: Not on file   Highest education level: Not on file  Occupational History   Occupation: mammogram tech    Employer: [employer]  Tobacco Use   Smoking status: Never   Smokeless tobacco: Never  Vaping Use   Vaping Use: Never used  Substance and Sexual Activity   Alcohol use: Yes    Comment: Socially on the weekends   Drug use: Never   Sexual activity: Not on file  Other Topics Concern   Not on file  Social History Narrative   Lives alone in a 2 story home.  No children.    Highest level of education:  Bachelors x 2   Social Determinants of Corporate investment banker Strain: Not on file  Food Insecurity: Not on file  Transportation Needs: Not on file  Physical Activity: Not on file  Stress: Not on file  Social Connections: Not on file  Intimate Partner Violence: Not on file    Past Medical History, Surgical history, Social history, and Family history were reviewed and updated  as appropriate.   Please see review of systems for further details on the patient's review from today.   Objective:   Physical Exam:  There were no vitals taken for this visit.  Physical Exam Constitutional:      General: She is not in acute distress. Musculoskeletal:        General: No deformity.  Neurological:     Mental Status: She is alert and oriented to person, place, and time.     Coordination: Coordination normal.  Psychiatric:        Attention and Perception: Attention and perception normal. She does not perceive auditory or visual hallucinations.        Mood and Affect: Mood normal. Mood is not anxious or depressed. Affect is not labile, blunt, angry or inappropriate.  Speech: Speech normal.        Behavior: Behavior normal.        Thought Content: Thought content normal. Thought content is not paranoid or delusional. Thought content does not include homicidal or suicidal ideation. Thought content does not include homicidal or suicidal plan.        Cognition and Memory: Cognition and memory normal.        Judgment: Judgment normal.     Comments: Insight intact    Lab Review:  No results found for: NA, K, CL, CO2, GLUCOSE, BUN, CREATININE, CALCIUM, PROT, ALBUMIN, AST, ALT, ALKPHOS, BILITOT, GFRNONAA, GFRAA  No results found for: WBC, RBC, HGB, HCT, PLT, MCV, MCH, MCHC, RDW, LYMPHSABS, MONOABS, EOSABS, BASOSABS  No results found for: POCLITH, LITHIUM   No results found for: PHENYTOIN, PHENOBARB, VALPROATE, CBMZ   .res Assessment: Plan:    Plan:  1. Cymbalta 60mg  BID 2. Hydroxyzine 25mg  tablet - 4 tablets daily prn 3. Xanax 0.25mg  daily to TID prn anxiety attacks  Synthroid Progesteron Estradiol patch Lisinopril Singulair Albuterol  RTC 3/6 months  Patient advised to contact office with any questions, adverse effects, or acute worsening in signs and symptoms.  Discussed potential benefits, risk, and side effects of benzodiazepines to  include potential risk of tolerance and dependence, as well as possible drowsiness.  Advised patient not to drive if experiencing drowsiness and to take lowest possible effective dose to minimize risk of dependence and tolerance.   Diagnoses and all orders for this visit:  Insomnia, unspecified type -     DULoxetine (CYMBALTA) 60 MG capsule; Take 1 capsule (60 mg total) by mouth 2 (two) times daily.  Major depressive disorder, recurrent episode, moderate (HCC) -     DULoxetine (CYMBALTA) 60 MG capsule; Take 1 capsule (60 mg total) by mouth 2 (two) times daily. -     DULoxetine (CYMBALTA) 30 MG capsule; Take 1 capsule (30 mg total) by mouth daily.  Eating disorder, unspecified type -     DULoxetine (CYMBALTA) 60 MG capsule; Take 1 capsule (60 mg total) by mouth 2 (two) times daily.  PTSD (post-traumatic stress disorder) -     DULoxetine (CYMBALTA) 60 MG capsule; Take 1 capsule (60 mg total) by mouth 2 (two) times daily.  Generalized anxiety disorder -     DULoxetine (CYMBALTA) 60 MG capsule; Take 1 capsule (60 mg total) by mouth 2 (two) times daily. -     DULoxetine (CYMBALTA) 30 MG capsule; Take 1 capsule (30 mg total) by mouth daily.    Please see After Visit Summary for patient specific instructions.  No future appointments.  No orders of the defined types were placed in this encounter.     -------------------------------

## 2021-05-07 ENCOUNTER — Telehealth (INDEPENDENT_AMBULATORY_CARE_PROVIDER_SITE_OTHER): Payer: BLUE CROSS/BLUE SHIELD | Admitting: Adult Health

## 2021-05-07 ENCOUNTER — Encounter: Payer: Self-pay | Admitting: Adult Health

## 2021-05-07 DIAGNOSIS — F411 Generalized anxiety disorder: Secondary | ICD-10-CM | POA: Diagnosis not present

## 2021-05-07 DIAGNOSIS — G4700 Insomnia, unspecified: Secondary | ICD-10-CM

## 2021-05-07 DIAGNOSIS — F4310 Post-traumatic stress disorder, unspecified: Secondary | ICD-10-CM | POA: Diagnosis not present

## 2021-05-07 DIAGNOSIS — F331 Major depressive disorder, recurrent, moderate: Secondary | ICD-10-CM | POA: Diagnosis not present

## 2021-05-07 DIAGNOSIS — F509 Eating disorder, unspecified: Secondary | ICD-10-CM

## 2021-05-07 MED ORDER — ALPRAZOLAM 0.25 MG PO TABS: 0.2500 mg | ORAL_TABLET | Freq: Three times a day (TID) | ORAL | 2 refills | Status: DC | PRN

## 2021-05-07 MED ORDER — BUPROPION HCL ER (XL) 150 MG PO TB24: 150.0000 mg | ORAL_TABLET | Freq: Every day | ORAL | 5 refills | Status: AC

## 2021-05-07 MED ORDER — DULOXETINE HCL 60 MG PO CPEP: 60.0000 mg | ORAL_CAPSULE | Freq: Two times a day (BID) | ORAL | 1 refills | Status: DC

## 2021-05-07 NOTE — Progress Notes (Signed)
Deborah Crawford [id number] [birth year] 55 y.o.  Virtual Visit via Video Note  I connected with pt @ on 05/07/21 at  8:40 AM EST by a video enabled telemedicine application and verified that I am speaking with the correct person using two identifiers.   I discussed the limitations of evaluation and management by telemedicine and the availability of in person appointments. The patient expressed understanding and agreed to proceed.  I discussed the assessment and treatment plan with the patient. The patient was provided an opportunity to ask questions and all were answered. The patient agreed with the plan and demonstrated an understanding of the instructions.   The patient was advised to call back or seek an in-person evaluation if the symptoms worsen or if the condition fails to improve as anticipated.  I provided 25 minutes of non-face-to-face time during this encounter.  The patient was located at home.  The provider was located at [facility].   Dorothyann Gibbs, NP   Subjective:   Patient ID:  Deborah Crawford is a 55 y.o. (DOB 12/13/1965) female.  Chief Complaint: No chief complaint on file.   HPI Deborah Crawford presents for follow-up of MDD, GAD, PTSD, insomnia, and Binge eating disorder.   Describes mood today as "not the best". Pleasant. Tearful at times. Mood symptoms - reports depression and anxiety. Denies irritability. Denies panic attacks. Stating "I've been feeling more depressed". Has made some changes to medications and returned to the prescribed doses. Mood declined over the past few months. Would like to add Wellbutrin XL 150mg  for mood symptoms. Working with PCP to manage medical issues. Improved interest and motivation. Taking medications as prescribed.  Energy levels lower. Active, does not have a regular exercise routine. Walking dog.  Enjoys some usual interests and activities. Single. Lives alone with dog "Koha". Mostly staying home. Spending time  with family. Appetite increased. Weight gain. Sleeping difficulties. Averages 4 hours of solid sleep - then doses. Having nightmares - night terror. Focus and concentration difficulties. Completing tasks. Managing aspects of household. 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 Seeing therapists - weekly Seeing a dietician.  Previous medications: Prozac - tired, Zoloft, Lexapro, Paxil, Effexor - SI, Wellbutrin, Buspar-nightmares, Abilify - side effects.   Review of Systems:  Review of Systems  Musculoskeletal:  Negative for gait problem.  Neurological:  Negative for tremors.  Psychiatric/Behavioral:         Please refer to HPI   Medications: I have reviewed the patient's current medications.  Current Outpatient Medications  Medication Sig Dispense Refill   ALPRAZolam (XANAX) 0.25 MG tablet Take 1 tablet (0.25 mg total) by mouth 3 (three) times daily as needed for anxiety. 90 tablet 2   amphetamine-dextroamphetamine (ADDERALL XR) 10 MG 24 hr capsule Take 1 capsule (10 mg total) by mouth daily. 30 capsule 0   B Complex Vitamins (VITAMIN B COMPLEX) TABS Take by mouth.     Biotin 1 MG CAPS Take by mouth.     Cholecalciferol 50 MCG (2000 UT) CAPS Take by mouth.     doxycycline (PERIOSTAT) 20 MG tablet      DULoxetine (CYMBALTA) 30 MG capsule Take 1 capsule (30 mg total) by mouth daily. 90 capsule 1   DULoxetine (CYMBALTA) 60 MG capsule Take 1 capsule (60 mg total) by mouth 2 (two) times daily. 180 capsule 1   hydrOXYzine (ATARAX/VISTARIL) 25 MG tablet Take one tablet up to 4 times daily prn sleep/anxiety.  360 tablet 1   levothyroxine (SYNTHROID, LEVOTHROID) 150 MCG tablet Take 1 tablet by mouth daily.     Multiple Vitamin (MULTI-VITAMIN) tablet Take by mouth.     polyethylene glycol-electrolytes (NULYTELY/GOLYTELY) 420 g solution      spironolactone (ALDACTONE) 50 MG tablet TK 1 T PO QD UTD     SYNTHROID 100 MCG tablet Take 100 mcg by mouth every  morning.     SYNTHROID 112 MCG tablet Take 112 mcg by mouth daily.     zolpidem (AMBIEN) 5 MG tablet Take 1 tablet (5 mg total) by mouth at bedtime as needed for sleep. 30 tablet 0   No current facility-administered medications for this visit.    Medication Side Effects: None  Allergies:  Allergies  Allergen Reactions   Oxycodone-Acetaminophen Other (See Comments)    Severe migraine   Mangifera Indica Other (See Comments)    Lips tingle   Latex Other (See Comments), Itching and Rash    Past Medical History:  Diagnosis Date   Anxiety    Hand arthritis    hypothyroidism    Primary insomnia    Retinal detachment     Family History  Problem Relation Age of Onset   Esophageal cancer Mother    Rashes / Skin problems Mother    Rheum arthritis Mother    Hypertension Father    Hypertension Maternal Grandmother    Hypertension Maternal Grandfather     Social History   Socioeconomic History   Marital status: Single    Spouse name: Not on file   Number of children: 0   Years of education: Not on file   Highest education level: Not on file  Occupational History   Occupation: mammogram tech    Employer: [employer]  Tobacco Use   Smoking status: Never   Smokeless tobacco: Never  Vaping Use   Vaping Use: Never used  Substance and Sexual Activity   Alcohol use: Yes    Comment: Socially on the weekends   Drug use: Never   Sexual activity: Not on file  Other Topics Concern   Not on file  Social History Narrative   Lives alone in a 2 story home.  No children.    Highest level of education:  Bachelors x 2   Social Determinants of Corporate investment banker Strain: Not on file  Food Insecurity: Not on file  Transportation Needs: Not on file  Physical Activity: Not on file  Stress: Not on file  Social Connections: Not on file  Intimate Partner Violence: Not on file    Past Medical History, Surgical history, Social history, and Family history were reviewed  and updated as appropriate.   Please see review of systems for further details on the patient's review from today.   Objective:   Physical Exam:  There were no vitals taken for this visit.  Physical Exam Constitutional:      General: She is not in acute distress. Musculoskeletal:        General: No deformity.  Neurological:     Mental Status: She is alert and oriented to person, place, and time.     Coordination: Coordination normal.  Psychiatric:        Attention and Perception: Attention and perception normal. She does not perceive auditory or visual hallucinations.        Mood and Affect: Mood normal. Mood is not anxious or depressed. Affect is not labile, blunt, angry or inappropriate.  Speech: Speech normal.        Behavior: Behavior normal.        Thought Content: Thought content normal. Thought content is not paranoid or delusional. Thought content does not include homicidal or suicidal ideation. Thought content does not include homicidal or suicidal plan.        Cognition and Memory: Cognition and memory normal.        Judgment: Judgment normal.     Comments: Insight intact    Lab Review:  No results found for: NA, K, CL, CO2, GLUCOSE, BUN, CREATININE, CALCIUM, PROT, ALBUMIN, AST, ALT, ALKPHOS, BILITOT, GFRNONAA, GFRAA  No results found for: WBC, RBC, HGB, HCT, PLT, MCV, MCH, MCHC, RDW, LYMPHSABS, MONOABS, EOSABS, BASOSABS  No results found for: POCLITH, LITHIUM   No results found for: PHENYTOIN, PHENOBARB, VALPROATE, CBMZ   .res Assessment: Plan:    Plan:  1. Cymbalta 60mg  BID 2. Hydroxyzine 25mg  tablet - 4 tablets daily prn 3. Xanax 0.25mg  daily to TID prn anxiety attacks 4. Add Wellbutrin XL 150mg  daily - denies seizure history  Synthroid Progesteron Estradiol patch Lisinopril Singulair Albuterol  RTC 3/6 months  Patient advised to contact office with any questions, adverse effects, or acute worsening in signs and symptoms.  Discussed  potential benefits, risk, and side effects of benzodiazepines to include potential risk of tolerance and dependence, as well as possible drowsiness.  Advised patient not to drive if experiencing drowsiness and to take lowest possible effective dose to minimize risk of dependence and tolerance.  There are no diagnoses linked to this encounter.   Please see After Visit Summary for patient specific instructions.  No future appointments.  No orders of the defined types were placed in this encounter.     -------------------------------

## 2021-05-13 ENCOUNTER — Telehealth: Payer: Self-pay | Admitting: Adult Health

## 2021-05-13 NOTE — Telephone Encounter (Signed)
Deborah Crawford stating that she began taking Bupropion 150mg  last Friday and last night and today she has been experiencing "lots of dizziness." She asked if this could possibly be from the Bupropion and if it will go away or if he has anything to with the Bupropion at all. Pls rtc to discuss [phone]

## 2021-05-14 NOTE — Telephone Encounter (Signed)
It could be but not typical. Have her continue if symptoms have passed.

## 2021-05-14 NOTE — Telephone Encounter (Signed)
Pt stated she is no longer dizzy.She wants to know if it this is a normal side effect.She stated she will call back if the dizziness returns

## 2021-07-27 ENCOUNTER — Telehealth (INDEPENDENT_AMBULATORY_CARE_PROVIDER_SITE_OTHER): Payer: BLUE CROSS/BLUE SHIELD | Admitting: Adult Health

## 2021-07-27 ENCOUNTER — Encounter: Payer: Self-pay | Admitting: Adult Health

## 2021-07-27 DIAGNOSIS — G4700 Insomnia, unspecified: Secondary | ICD-10-CM | POA: Diagnosis not present

## 2021-07-27 DIAGNOSIS — F331 Major depressive disorder, recurrent, moderate: Secondary | ICD-10-CM | POA: Diagnosis not present

## 2021-07-27 DIAGNOSIS — F411 Generalized anxiety disorder: Secondary | ICD-10-CM | POA: Diagnosis not present

## 2021-07-27 DIAGNOSIS — F4310 Post-traumatic stress disorder, unspecified: Secondary | ICD-10-CM | POA: Diagnosis not present

## 2021-07-27 DIAGNOSIS — F332 Major depressive disorder, recurrent severe without psychotic features: Secondary | ICD-10-CM

## 2021-07-27 DIAGNOSIS — F509 Eating disorder, unspecified: Secondary | ICD-10-CM

## 2021-07-27 NOTE — Progress Notes (Signed)
Jani Files Altice ?[id number] ?Jun 20, 1965 ?56 y.o. ? ?Virtual Visit via Video Note ? ?I connected with pt @ on 07/27/21 at  8:40 AM EDT by a video enabled telemedicine application and verified that I am speaking with the correct person using two identifiers. ?  ?I discussed the limitations of evaluation and management by telemedicine and the availability of in person appointments. The patient expressed understanding and agreed to proceed. ? ?I discussed the assessment and treatment plan with the patient. The patient was provided an opportunity to ask questions and all were answered. The patient agreed with the plan and demonstrated an understanding of the instructions. ?  ?The patient was advised to call back or seek an in-person evaluation if the symptoms worsen or if the condition fails to improve as anticipated. ? ?I provided 25 minutes of non-face-to-face time during this encounter.  The patient was located at home.  The provider was located at [facility]. ? ? ?Dorothyann Gibbs, NP  ? ?Subjective:  ? ?Patient ID:  Deborah Crawford is a 56 y.o. (DOB 10-May-1966) female. ? ?Chief Complaint: No chief complaint on file. ? ? ?HPI ?Deborah Crawford presents for follow-up of  MDD, GAD, PTSD, insomnia, and Binge eating disorder.  ? ?Describes mood today as "not the best". Pleasant. Tearful at times. Mood symptoms - reports decreased depression - "loneliness" and anxiety. Denies irritability. Denies panic attacks. Stating "I'm doing better". Feels like addition of Wellbutrin was helpful. Recovering from Covid - 2nd time. Working with PCP to manage medical issues. Improved interest and motivation. Taking medications as prescribed.  ?Energy levels lower. Active, does not have a regular exercise routine. Walking dog.  ?Enjoys some usual interests and activities. Single. Lives alone with dog "Koha". Mostly staying home. Getting out some. ?Appetite increased. Weight gain - 80 pounds overweight - recently loss 10  pounds with Covid. ?Sleeping difficulties. Averages 4 hours of solid sleep, then broken sleep. Having nightmares every night since Covid. ?Focus and concentration difficulties. Completing tasks. Managing aspects of household. 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  ?Denies AH or VH. ?Working with GYN - Dr Raynelle Jan - started on Wegovy 2 weeks ago. ? ?Previous medications: Prozac - tired, Zoloft, Lexapro, Paxil, Effexor - SI, Wellbutrin, Buspar-nightmares, Abilify - side effects. ? ? ?Review of Systems:  ?Review of Systems  ?Musculoskeletal:  Negative for gait problem.  ?Neurological:  Negative for tremors.  ?Psychiatric/Behavioral:    ?     Please refer to HPI  ? ?Medications: I have reviewed the patient's current medications. ? ?Current Outpatient Medications  ?Medication Sig Dispense Refill  ? ALPRAZolam (XANAX) 0.25 MG tablet Take 1 tablet (0.25 mg total) by mouth 3 (three) times daily as needed for anxiety. 90 tablet 2  ? buPROPion (WELLBUTRIN XL) 150 MG 24 hr tablet Take 1 tablet (150 mg total) by mouth daily. 30 tablet 5  ? DULoxetine (CYMBALTA) 60 MG capsule Take 1 capsule (60 mg total) by mouth 2 (two) times daily. 180 capsule 1  ? levothyroxine (SYNTHROID, LEVOTHROID) 150 MCG tablet Take 1 tablet by mouth daily.    ? SYNTHROID 112 MCG tablet Take 112 mcg by mouth daily.    ? zolpidem (AMBIEN) 5 MG tablet Take 1 tablet (5 mg total) by mouth at bedtime as needed for sleep. 30 tablet 0  ? ?No current facility-administered medications for this visit.  ? ? ?Medication Side Effects: None ? ?Allergies:  ?Allergies  ?Allergen Reactions  ? Mangifera Indica  Other (See Comments) and Swelling  ?  Lips tingle  ? Oxycodone-Acetaminophen Other (See Comments)  ?  Severe migraine  ? Latex Other (See Comments), Itching and Rash  ? Scopolamine   ?  Other reaction(s): Confusion/Altered Mental Status ?Hallucinations   ? ? ?Past Medical History:  ?Diagnosis Date  ? Anxiety   ? Hand arthritis   ?  hypothyroidism   ? Primary insomnia   ? Retinal detachment   ? ? ?Family History  ?Problem Relation Age of Onset  ? Esophageal cancer Mother   ? Rashes / Skin problems Mother   ? Rheum arthritis Mother   ? Hypertension Father   ? Hypertension Maternal Grandmother   ? Hypertension Maternal Grandfather   ? ? ?Social History  ? ?Socioeconomic History  ? Marital status: Single  ?  Spouse name: Not on file  ? Number of children: 0  ? Years of education: Not on file  ? Highest education level: Not on file  ?Occupational History  ? Occupation: mammogram tech  ?  Employer: [employer]  ?Tobacco Use  ? Smoking status: Never  ? Smokeless tobacco: Never  ?Vaping Use  ? Vaping Use: Never used  ?Substance and Sexual Activity  ? Alcohol use: Yes  ?  Comment: Socially on the weekends  ? Drug use: Never  ? Sexual activity: Not on file  ?Other Topics Concern  ? Not on file  ?Social History Narrative  ? Lives alone in a 2 story home.  No children.   ? Highest level of education:  Bachelors x 2  ? ?Social Determinants of Health  ? ?Financial Resource Strain: Not on file  ?Food Insecurity: Not on file  ?Transportation Needs: Not on file  ?Physical Activity: Not on file  ?Stress: Not on file  ?Social Connections: Not on file  ?Intimate Partner Violence: Not on file  ? ? ?Past Medical History, Surgical history, Social history, and Family history were reviewed and updated as appropriate.  ? ?Please see review of systems for further details on the patient's review from today.  ? ?Objective:  ? ?Physical Exam:  ?There were no vitals taken for this visit. ? ?Physical Exam ?Constitutional:   ?   General: She is not in acute distress. ?Musculoskeletal:     ?   General: No deformity.  ?Neurological:  ?   Mental Status: She is alert and oriented to person, place, and time.  ?   Coordination: Coordination normal.  ?Psychiatric:     ?   Attention and Perception: Attention and perception normal. She does not perceive auditory or visual  hallucinations.     ?   Mood and Affect: Mood normal. Mood is not anxious or depressed. Affect is not labile, blunt, angry or inappropriate.     ?   Speech: Speech normal.     ?   Behavior: Behavior normal.     ?   Thought Content: Thought content normal. Thought content is not paranoid or delusional. Thought content does not include homicidal or suicidal ideation. Thought content does not include homicidal or suicidal plan.     ?   Cognition and Memory: Cognition and memory normal.     ?   Judgment: Judgment normal.  ?   Comments: Insight intact  ? ? ?Lab Review:  ?No results found for: NA, K, CL, CO2, GLUCOSE, BUN, CREATININE, CALCIUM, PROT, ALBUMIN, AST, ALT, ALKPHOS, BILITOT, GFRNONAA, GFRAA ? ?No results found for: WBC, RBC, HGB, HCT, PLT, MCV,  MCH, MCHC, RDW, LYMPHSABS, MONOABS, EOSABS, BASOSABS ? ?No results found for: POCLITH, LITHIUM  ? ?No results found for: PHENYTOIN, PHENOBARB, VALPROATE, CBMZ  ? ?.res ?Assessment: Plan:   ? ? ?Plan: ? ?1. Cymbalta 60mg  BID ?2. Hydroxyzine 25mg  tablet - 4 tablets daily prn ?3. Xanax 0.25mg  daily to TID prn anxiety attacks ?4. Wellbutrin XL 150mg  daily - denies seizure history ? ?Synthroid 150mcg ?Progesteron ?Estradiol patch ?Lisinopril ?Singulair ?Albuterol ? ?RTC 3/6 months ? ?Patient advised to contact office with any questions, adverse effects, or acute worsening in signs and symptoms. ? ?Discussed potential benefits, risk, and side effects of benzodiazepines to include potential risk of tolerance and dependence, as well as possible drowsiness.  Advised patient not to drive if experiencing drowsiness and to take lowest possible effective dose to minimize risk of dependence and tolerance. ? ? ? ?Diagnoses and all orders for this visit: ? ?Major depressive disorder, recurrent episode, moderate (HCC) ? ?PTSD (post-traumatic stress disorder) ? ?Insomnia, unspecified type ? ?Generalized anxiety disorder ? ?Eating disorder, unspecified type ? ?Severe episode of recurrent  major depressive disorder, without psychotic features (HCC) ? ?  ? ?Please see After Visit Summary for patient specific instructions. ? ?Future Appointments  ?Date Time Provider Department Center  ?07/27/2021  8:40 AM

## 2023-02-09 ENCOUNTER — Encounter: Payer: Self-pay | Admitting: Adult Health

## 2023-02-09 ENCOUNTER — Telehealth: Payer: BLUE CROSS/BLUE SHIELD | Admitting: Adult Health

## 2023-02-09 DIAGNOSIS — G4700 Insomnia, unspecified: Secondary | ICD-10-CM | POA: Diagnosis not present

## 2023-02-09 DIAGNOSIS — F411 Generalized anxiety disorder: Secondary | ICD-10-CM | POA: Diagnosis not present

## 2023-02-09 DIAGNOSIS — F4310 Post-traumatic stress disorder, unspecified: Secondary | ICD-10-CM

## 2023-02-09 DIAGNOSIS — F509 Eating disorder, unspecified: Secondary | ICD-10-CM

## 2023-02-09 DIAGNOSIS — F331 Major depressive disorder, recurrent, moderate: Secondary | ICD-10-CM

## 2023-02-09 MED ORDER — ALPRAZOLAM 0.25 MG PO TABS: 0.2500 mg | ORAL_TABLET | Freq: Three times a day (TID) | ORAL | 2 refills | Status: AC | PRN

## 2023-02-09 MED ORDER — DULOXETINE HCL 60 MG PO CPEP: 60.0000 mg | ORAL_CAPSULE | Freq: Two times a day (BID) | ORAL | 3 refills | Status: AC

## 2023-02-09 NOTE — Progress Notes (Signed)
Deborah Crawford [id number] 11/27/1965 57 y.o.  Virtual Visit via Video Note  I connected with pt @ on 02/09/23 at  2:00 PM EDT by a video enabled telemedicine application and verified that I am speaking with the correct person using two identifiers.   I discussed the limitations of evaluation and management by telemedicine and the availability of in person appointments. The patient expressed understanding and agreed to proceed.  I discussed the assessment and treatment plan with the patient. The patient was provided an opportunity to ask questions and all were answered. The patient agreed with the plan and demonstrated an understanding of the instructions.   The patient was advised to call back or seek an in-person evaluation if the symptoms worsen or if the condition fails to improve as anticipated.  I provided 25 minutes of non-face-to-face time during this encounter.  The patient was located at home.  The provider was located at [facility].   Deborah Gibbs, NP   Subjective:   Patient ID:  Deborah Crawford is a 57 y.o. (DOB 1965/10/12) female.  Chief Complaint: No chief complaint on file.   HPI Deborah Crawford presents for follow-up of MDD, GAD, PTSD, insomnia, and Binge eating disorder.   Describes mood today as "not the best". Pleasant. Tearful at times. Mood symptoms - reports depression, anxiety and irritability. Reports feeling angry. Reports feeling overwhelmed. Reports anxiety attacks. Reports worry, rumination and over thinking. Stating "I haven't been doing too good". Feels like medications are helpful, but may need to be increased. Has reduced dose of Cymbalta from 120mg  to 60mg , but is willing to increase dose back to the 120mg  daily. Taking medications as prescribed.  Energy levels lower. Active, unable to establish a regular exercise routine with pain issues.   Enjoys some usual interests and activities. Single. Lives alone with 2 dogs. Mostly staying  home. Getting out some. Appetite adequate. Reports weight loss of 55 pounds - 179 pounds. 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 Sleeping difficulties. Averages 7 hours of broken sleep - reports increased nightmares. Focus and concentration difficulties. Completing tasks. Managing aspects of household. 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  Denies AH or VH. Denies self harm. Denies substance use. Working with GYN - Dr Raynelle Jan.  Diagnosed with RA.   Previous medications: Prozac - tired, Zoloft, Lexapro, Paxil, Effexor - SI, Wellbutrin, Buspar-nightmares, Abilify - side effects.  Review of Systems:  Review of Systems  Musculoskeletal:  Negative for gait problem.  Neurological:  Negative for tremors.  Psychiatric/Behavioral:         Please refer to HPI    Medications: I have reviewed the patient's current medications.  Current Outpatient Medications  Medication Sig Dispense Refill   ALPRAZolam (XANAX) 0.25 MG tablet Take 1 tablet (0.25 mg total) by mouth 3 (three) times daily as needed for anxiety. 90 tablet 2   buPROPion (WELLBUTRIN XL) 150 MG 24 hr tablet Take 1 tablet (150 mg total) by mouth daily. 30 tablet 5   DULoxetine (CYMBALTA) 60 MG capsule Take 1 capsule (60 mg total) by mouth 2 (two) times daily. 180 capsule 1   levothyroxine (SYNTHROID, LEVOTHROID) 150 MCG tablet Take 1 tablet by mouth daily.     SYNTHROID 112 MCG tablet Take 112 mcg by mouth daily.     zolpidem (AMBIEN) 5 MG tablet Take 1 tablet (5 mg total) by mouth at bedtime as needed for sleep. 30 tablet 0  No current facility-administered medications for this visit.    Medication Side Effects: None  Allergies:  Allergies  Allergen Reactions   Mangifera Indica Other (See Comments) and Swelling    Lips tingle   Oxycodone-Acetaminophen Other (See Comments)    Severe migraine   Latex Other (See Comments), Itching and Rash   Scopolamine     Other  reaction(s): Confusion/Altered Mental Status Hallucinations     Past Medical History:  Diagnosis Date   Anxiety    Hand arthritis    hypothyroidism    Primary insomnia    Retinal detachment     Family History  Problem Relation Age of Onset   Esophageal cancer Mother    Rashes / Skin problems Mother    Rheum arthritis Mother    Hypertension Father    Hypertension Maternal Grandmother    Hypertension Maternal Grandfather     Social History   Socioeconomic History   Marital status: Single    Spouse name: Not on file   Number of children: 0   Years of education: Not on file   Highest education level: Not on file  Occupational History   Occupation: mammogram tech    Employer: [employer]  Tobacco Use   Smoking status: Never   Smokeless tobacco: Never  Vaping Use   Vaping status: Never Used  Substance and Sexual Activity   Alcohol use: Yes    Comment: Socially on the weekends   Drug use: Never   Sexual activity: Not on file  Other Topics Concern   Not on file  Social History Narrative   Lives alone in a 2 story home.  No children.    Highest level of education:  Bachelors x 2   Social Determinants of Health   Financial Resource Strain: Low Risk  (12/22/2020)   Received from [facility]   Overall Financial Resource Strain (CARDIA)    Difficulty of Paying Living Expenses: Not hard at all  Food Insecurity: No Food Insecurity (12/22/2020)   Received from [facility]   Hunger Vital Sign    Worried About Running Out of Food in the Last Year: Never true    Ran Out of Food in the Last Year: Never true  Transportation Needs: No Transportation Needs (12/22/2020)   Received from [facility]   PRAPARE - Transportation    Lack of Transportation (Medical): No    Lack of Transportation (Non-Medical): No  Physical Activity: Not on file  Stress: Not on file  Social Connections: Unknown (07/06/2022)   Received [facility]   Social Network    Social Network: Not on file  Intimate Partner Violence: Not At Risk (10/24/2022)   Received from [facility]   HITS    Over the last 12 months how often did your partner physically hurt you?: 1    Over the last 12 months how often did your partner insult you or talk down to you?: 1    Over the last 12 months how often did your partner threaten you with physical harm?: 1    Over the last 12 months how often did your partner scream or curse at you?: 1    Past Medical History, Surgical history, Social history, and Family history were reviewed and updated as appropriate.   Please see review of systems for further details on the patient's review from today.   Objective:   Physical Exam:  There were no vitals  taken for this visit.  Physical Exam Constitutional:      General: She is not in acute distress. Musculoskeletal:        General: No deformity.  Neurological:     Mental Status: She is alert and oriented to person, place, and time.     Coordination: Coordination normal.  Psychiatric:        Attention and Perception: Attention and perception normal. She does not perceive auditory or visual hallucinations.        Mood and Affect: Mood normal. Mood is not anxious or depressed. Affect is not labile, blunt, angry or inappropriate.        Speech: Speech normal.        Behavior: Behavior normal.        Thought Content: Thought content normal. Thought content is not paranoid or delusional. Thought content does not include homicidal or suicidal ideation. Thought content does not include homicidal or suicidal plan.        Cognition and Memory: Cognition and memory normal.        Judgment: Judgment normal.     Comments: Insight intact     Lab Review:  No results found for: "NA", "K", "CL", "CO2", "GLUCOSE", "BUN", "CREATININE", "CALCIUM", "PROT", "ALBUMIN", "AST", "ALT", "ALKPHOS", "BILITOT", "GFRNONAA", "GFRAA"  No results found for: "WBC", "RBC", "HGB",  "HCT", "PLT", "MCV", "MCH", "MCHC", "RDW", "LYMPHSABS", "MONOABS", "EOSABS", "BASOSABS"  No results found for: "POCLITH", "LITHIUM"   No results found for: "PHENYTOIN", "PHENOBARB", "VALPROATE", "CBMZ"   .res Assessment: Plan:    Plan:  1. Cymbalta 60mg  BID 2. Hydroxyzine 25mg  tablet - 4 tablets daily prn 3. Xanax 0.25mg  daily to TID prn anxiety attacks 4. Wellbutrin XL 150mg  daily - denies seizure history  Synthroid Progesteron Estradiol patch Lisinopril Singulair Albuterol  RTC 3/6 months  Patient advised to contact office with any questions, adverse effects, or acute worsening in signs and symptoms.  Discussed potential benefits, risk, and side effects of benzodiazepines to include potential risk of tolerance and dependence, as well as possible drowsiness.  Advised patient not to drive if experiencing drowsiness and to take lowest possible effective dose to minimize risk of dependence and tolerance.  There are no diagnoses linked to this encounter.   Please see After Visit Summary for patient specific instructions.  Future Appointments  Date Time Provider Department Center  02/09/2023  2:00 PM Kimblery Diop, Thereasa Solo, NP [facility] None    No orders of the defined types were placed in this encounter.     -------------------------------

## 2023-04-04 ENCOUNTER — Telehealth: Payer: Self-pay | Admitting: Adult Health

## 2023-04-04 NOTE — Telephone Encounter (Signed)
Pt called at 10:03a wanting to find out what Almira Coaster thinks about TMS therapy and also she is interested in how to get a letter for her dog to be an ESA. She had a panic attack going to get blood work and thinks he would be a comfort for times when she gets anxious, if she can get a letter from [place]. She said you can LVM.  No upcoming appt scheduled.

## 2023-04-12 NOTE — Telephone Encounter (Signed)
Pt has appt on 12/6 

## 2023-04-15 ENCOUNTER — Telehealth: Payer: BLUE CROSS/BLUE SHIELD | Admitting: Adult Health

## 2024-03-01 ENCOUNTER — Telehealth: Admitting: Adult Health
